# Patient Record
Sex: Female | Born: 1995 | Race: White | Hispanic: No | Marital: Single | Smoking: Never smoker
Health system: Southern US, Community
[De-identification: ages and names within clinical notes are randomized; demographics above are authoritative.]

## PROBLEM LIST (undated history)

## (undated) DIAGNOSIS — G4700 Insomnia, unspecified: Secondary | ICD-10-CM

## (undated) DIAGNOSIS — F32A Depression, unspecified: Secondary | ICD-10-CM

## (undated) DIAGNOSIS — F329 Major depressive disorder, single episode, unspecified: Secondary | ICD-10-CM

## (undated) DIAGNOSIS — F419 Anxiety disorder, unspecified: Secondary | ICD-10-CM

## (undated) DIAGNOSIS — R110 Nausea: Secondary | ICD-10-CM

## (undated) HISTORY — PX: TONSILLECTOMY: SUR1361

## (undated) HISTORY — PX: OTHER SURGICAL HISTORY: SHX169

---
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
## 2020-02-08 ENCOUNTER — Encounter (HOSPITAL_COMMUNITY): Payer: Self-pay

## 2020-02-08 ENCOUNTER — Emergency Department (HOSPITAL_COMMUNITY): Payer: Medicaid Other

## 2020-02-08 ENCOUNTER — Emergency Department (HOSPITAL_COMMUNITY)
Admission: EM | Admit: 2020-02-08 | Discharge: 2020-02-08 | Disposition: A | Discharge status: Home / Self Care | Payer: Medicaid Other | Attending: Emergency Medicine | Admitting: Emergency Medicine

## 2020-02-08 ENCOUNTER — Other Ambulatory Visit: Payer: Self-pay

## 2020-02-08 DIAGNOSIS — Y999 Unspecified external cause status: Secondary | ICD-10-CM | POA: Diagnosis not present

## 2020-02-08 DIAGNOSIS — Z79899 Other long term (current) drug therapy: Secondary | ICD-10-CM | POA: Insufficient documentation

## 2020-02-08 DIAGNOSIS — S42302A Unspecified fracture of shaft of humerus, left arm, initial encounter for closed fracture: Secondary | ICD-10-CM

## 2020-02-08 DIAGNOSIS — Y9351 Activity, roller skating (inline) and skateboarding: Secondary | ICD-10-CM | POA: Diagnosis not present

## 2020-02-08 DIAGNOSIS — Y9239 Other specified sports and athletic area as the place of occurrence of the external cause: Secondary | ICD-10-CM | POA: Insufficient documentation

## 2020-02-08 DIAGNOSIS — S42342A Displaced spiral fracture of shaft of humerus, left arm, initial encounter for closed fracture: Secondary | ICD-10-CM | POA: Insufficient documentation

## 2020-02-08 DIAGNOSIS — W19XXXA Unspecified fall, initial encounter: Secondary | ICD-10-CM

## 2020-02-08 DIAGNOSIS — M79622 Pain in left upper arm: Secondary | ICD-10-CM | POA: Diagnosis present

## 2020-02-08 HISTORY — DX: Depression, unspecified: F32.A

## 2020-02-08 HISTORY — DX: Nausea: R11.0

## 2020-02-08 HISTORY — DX: Insomnia, unspecified: G47.00

## 2020-02-08 HISTORY — DX: Anxiety disorder, unspecified: F41.9

## 2020-02-08 MED ORDER — HYDROMORPHONE HCL 1 MG/ML IJ SOLN
1.0000 mg | Freq: Once | INTRAMUSCULAR | Status: AC
  Administered 2020-02-08: 1 mg via INTRAVENOUS
  Filled 2020-02-08: qty 1

## 2020-02-08 MED ORDER — HYDROMORPHONE HCL 1 MG/ML IJ SOLN
1.0000 mg | Freq: Once | INTRAMUSCULAR | Status: AC
  Administered 2020-02-08: 21:00:00 1 mg via INTRAVENOUS
  Filled 2020-02-08: qty 1

## 2020-02-08 MED ORDER — HYDROCODONE-ACETAMINOPHEN 5-325 MG PO TABS: 1.0000 | ORAL_TABLET | Freq: Four times a day (QID) | ORAL | 0 refills | Status: DC | PRN

## 2020-02-08 MED ORDER — HYDROCODONE-ACETAMINOPHEN 5-325 MG PO TABS
1.0000 | ORAL_TABLET | Freq: Once | ORAL | Status: AC
  Administered 2020-02-08: 1 via ORAL
  Filled 2020-02-08: qty 1

## 2020-02-08 MED ORDER — SODIUM CHLORIDE 0.9 % IV SOLN: INTRAVENOUS | Status: DC

## 2020-02-08 MED ORDER — ONDANSETRON HCL 4 MG/2ML IJ SOLN
4.0000 mg | Freq: Once | INTRAMUSCULAR | Status: AC
  Administered 2020-02-08: 19:00:00 4 mg via INTRAVENOUS
  Filled 2020-02-08: qty 2

## 2020-02-08 NOTE — Discharge Instructions (Addendum)
Keep the arm splint dry.  Orthopedic office will call you in the morning to tell you what time to be seen.  The doctor's information and address provided above.  They are planning to do operative repair of this on Monday.  They will discuss all that with you tomorrow.  Take the pain medicine as needed.

## 2020-02-08 NOTE — ED Provider Notes (Signed)
[facility] Provider Note   CSN: [id number] Arrival date & time: 02/08/20  1829     History Chief Complaint  Patient presents with  . Fall    [facility] is a 24 y.o. female.  Patient brought in by EMS.  Patient was [place] skate park.  She had a fall landing on her left elbow.  Pain no other injuries no loss of consciousness no back pain no neck pain no other extremity pain.        Past Medical History:  Diagnosis Date  . Anxiety   . Chronic nausea   . Depression   . Insomnia     Patient Active Problem List   Diagnosis Date Noted  . Fracture of humeral shaft, left, closed 02/08/2020      OB History   No obstetric history on file.     No family history on file.  Social History   Tobacco Use  . Smoking status: Never Smoker  . Smokeless tobacco: Never Used  Substance Use Topics  . Alcohol use: Not Currently  . Drug use: Not Currently    Home Medications Prior to Admission medications   Medication Sig Start Date End Date Taking? Authorizing Provider  busPIRone (BUSPAR) 15 MG tablet Take 15 mg by mouth 3 (three) times daily. 01/14/20  Yes [provider]  clonazePAM (KLONOPIN) 1 MG tablet Take 0.5 mg by mouth 2 (two) times daily. 01/31/20  Yes [provider]  etonogestrel (NEXPLANON) 68 MG IMPL implant 68 mg by Subdermal route once.   Yes [provider]  hydrOXYzine (ATARAX/VISTARIL) 50 MG tablet Take 100 mg by mouth at bedtime. 01/23/20  Yes [provider]  mirtazapine (REMERON) 45 MG tablet Take 45 mg by mouth at bedtime. 01/04/20  Yes [provider]  Multiple Vitamins-Minerals (MULTIVITAMIN WOMEN PO) Take 1 tablet by mouth daily.   Yes [provider]  VIIBRYD 20 MG TABS Take 20 mg by mouth at bedtime.  01/31/20  Yes [provider]  zolpidem (AMBIEN) 10 MG tablet Take 10 mg by mouth at bedtime.  01/22/20  Yes [provider]  HYDROcodone-acetaminophen  (NORCO/VICODIN) 5-325 MG tablet Take 1 tablet by mouth every 6 (six) hours as needed for moderate pain. 02/08/20   Fredia Sorrow, MD    Allergies    Scopolamine, Adhesive [tape], and Lavender oil  Review of Systems   Review of Systems  Constitutional: Negative for chills and fever.  HENT: Negative for congestion, rhinorrhea and sore throat.   Eyes: Negative for visual disturbance.  Respiratory: Negative for cough and shortness of breath.   Cardiovascular: Negative for chest pain and leg swelling.  Gastrointestinal: Negative for abdominal pain, diarrhea, nausea and vomiting.  Genitourinary: Negative for dysuria.  Musculoskeletal: Negative for back pain and neck pain.  Skin: Negative for rash.  Neurological: Negative for dizziness, light-headedness and headaches.  Hematological: Does not bruise/bleed easily.  Psychiatric/Behavioral: Negative for confusion.    Physical Exam Updated Vital Signs BP 121/85   Pulse (!) 102   Temp 98.2 F (36.8 C) (Oral)   Resp 18   Ht 1.753 m (5\' 9" )   Wt 63.5 kg   SpO2 98%   BMI 20.67 kg/m   Physical Exam Vitals and nursing note reviewed.  Constitutional:      General: She is not in acute distress.    Appearance: Normal appearance. She is well-developed.  HENT:     Head: Normocephalic and atraumatic.  Eyes:     Extraocular Movements: Extraocular movements intact.     Conjunctiva/sclera: Conjunctivae normal.  Cardiovascular:     Rate and Rhythm: Normal rate and regular rhythm.     Heart sounds: No murmur.  Pulmonary:     Effort: Pulmonary effort is normal. No respiratory distress.     Breath sounds: Normal breath sounds.  Abdominal:     Palpations: Abdomen is soft.     Tenderness: There is no abdominal tenderness.  Musculoskeletal:        General: Tenderness present. No deformity.     Cervical back: Neck supple.     Comments: Left arm without tenderness above the elbow.  No obvious deformity but it is in a foam splint.  Radial  pulse 2+ good movement of fingers.  Sensation grossly intact some diffuse numbness subjectively but not objectively.  No pain with range of motion of the fingers wrist.  No shoulder pain.  Particular good movement of the left wrist both extension and flexion.  Patient able to give a thumbs up sign.  No signs of any radial nerve palsy.  Skin:    General: Skin is warm and dry.     Capillary Refill: Capillary refill takes less than 2 seconds.  Neurological:     General: No focal deficit present.     Mental Status: She is alert and oriented to person, place, and time.     ED Results / Procedures / Treatments   Labs (all labs ordered are listed, but only abnormal results are displayed) Labs Reviewed - No data to display  EKG None  Radiology DG Elbow 2 Views Left  Result Date: 02/08/2020 CLINICAL DATA:  Fall pain EXAM: LEFT ELBOW - 2 VIEW COMPARISON:  None. FINDINGS: There is a mildly displaced obliquely oriented fracture seen through the distal humeral shaft. There is approximately half shaft with posterior displacement of the distal humerus. Overlying soft tissue swelling. IMPRESSION: Mildly displaced obliquely oriented distal humeral shaft fracture. Electronically Signed   By: Jonna Clark M.D.   On: 02/08/2020 20:12    Procedures Procedures (including critical care time)  Medications Ordered in ED Medications  0.9 %  sodium chloride infusion ( Intravenous New Bag/Given (Non-Interop) 02/08/20 1857)  ondansetron (ZOFRAN) injection 4 mg (4 mg Intravenous Given 02/08/20 1853)  HYDROmorphone (DILAUDID) injection 1 mg (1 mg Intravenous Given 02/08/20 1853)  HYDROmorphone (DILAUDID) injection 1 mg (1 mg Intravenous Given 02/08/20 2050)    ED Course  I have reviewed the triage vital signs and the nursing notes.  Pertinent labs & imaging results that were available during my care of the patient were reviewed by me and considered in my medical decision making (see chart for details).      MDM Rules/Calculators/A&P                     Discussed with Dr. Deno Etienne orthopedics.  Discussed the patient in the office.  Recommending long-arm posterior splint.  Planning on surgery on Monday.  Patient made aware.  Final Clinical Impression(s) / ED Diagnoses Final diagnoses:  Fall, initial encounter  Closed displaced spiral fracture of shaft of left humerus, initial encounter    Rx / DC Orders ED Discharge Orders         Ordered    HYDROcodone-acetaminophen (NORCO/VICODIN) 5-325 MG tablet  Every 6 hours PRN     02/08/20 2106           Vanetta Mulders, MD 02/08/20 2109

## 2020-02-08 NOTE — Progress Notes (Signed)
Orthopedic Tech Progress Note Patient Details:  Earla Charlie Dec 16, 1996 [id number]  Ortho Devices Type of Ortho Device: Arm sling, Post (long arm) splint Ortho Device/Splint Location: LUE Ortho Device/Splint Interventions: Ordered, Application   Post Interventions Patient Tolerated: Well Instructions Provided: Care of device   Ancil Linsey 02/08/2020, 9:26 PM

## 2020-02-08 NOTE — ED Triage Notes (Signed)
Pt arrived via [facility] from [employer] park. Pt was skating when she fell off her skateboard and landed on her left elbow. Pt c/o 7/10 left arm pain. Obvious deformity noted. EMS placed left arm split on pt. Pt denies any loss of consciousness or hitting her head.

## 2020-02-09 ENCOUNTER — Encounter (HOSPITAL_COMMUNITY): Payer: Self-pay | Admitting: Orthopaedic Surgery

## 2020-02-09 ENCOUNTER — Other Ambulatory Visit (HOSPITAL_COMMUNITY)
Admission: RE | Admit: 2020-02-09 | Discharge: 2020-02-09 | Disposition: A | Discharge status: Home / Self Care | Payer: Medicaid Other | Source: Ambulatory Visit | Attending: Orthopaedic Surgery | Admitting: Orthopaedic Surgery

## 2020-02-09 ENCOUNTER — Other Ambulatory Visit: Payer: Self-pay

## 2020-02-09 ENCOUNTER — Encounter: Payer: Self-pay | Admitting: Orthopaedic Surgery

## 2020-02-09 ENCOUNTER — Ambulatory Visit: Payer: Medicaid Other | Admitting: Orthopaedic Surgery

## 2020-02-09 DIAGNOSIS — Z01812 Encounter for preprocedural laboratory examination: Secondary | ICD-10-CM | POA: Diagnosis not present

## 2020-02-09 DIAGNOSIS — Z20822 Contact with and (suspected) exposure to covid-19: Secondary | ICD-10-CM | POA: Insufficient documentation

## 2020-02-09 DIAGNOSIS — S42342A Displaced spiral fracture of shaft of humerus, left arm, initial encounter for closed fracture: Secondary | ICD-10-CM

## 2020-02-09 LAB — SARS CORONAVIRUS 2 (TAT 6-24 HRS): SARS Coronavirus 2: NEGATIVE

## 2020-02-09 MED ORDER — OXYCODONE-ACETAMINOPHEN 5-325 MG PO TABS: 1.0000 | ORAL_TABLET | Freq: Three times a day (TID) | ORAL | 0 refills | Status: AC | PRN

## 2020-02-09 NOTE — Progress Notes (Signed)
Anesthesia Chart Review: SAME DAY WORK-UP  Case: [id number] Date/Time: 02/12/20 1216   Procedure: OPEN REDUCTION INTERNAL FIXATION (ORIF) LEFT HUMERAL SHAFT FRACTURE (Left Arm Upper)   Anesthesia type: General   Pre-op diagnosis: left humeral shaft fracture   Location: [facility] ROOM 04 / [facility]   Surgeons: Tarry Kos, MD      DISCUSSION: Patient is a 24 year old female scheduled for the above procedure. On 02/08/20, she fell while at a skate park and fractured her left humerus.  History of never smoker, anxiety, depression (with history of SI), insomnia, chronic nausea, tonsillectomy.  02/09/20 presurgical COVID-19 test in process.  She is for labs and anesthesia team evaluation on the day of surgery.     VS: 02/08/20: BP 131/90, HR 100    PROVIDERS: [place] [place] [employer] is listed as primary care provider practice - Seen by cardiologist Andria Frames, MD [facility] on 04/01/18 after referral for physician in [place] for consideration of POTS as a cause of her chronic nausea. She also gets bouts of tachycardia with occasional dizziness if gets up too fast. Drinking mostly caffeinated drinks at the time. He felts POTS was unlikely and discussed possibly tachycardiac due to dehydration or physiologic from potential eating disorder. He offered a Tilt Table Test to evaluate for dysautonomia but I don't see that it was ever done.    - In review of records in Care Everywhere ([facility]), she saw GI and endocrinology last in 2018 for evaluation for chronic nausea. She had tried and failed several antidepressants, histamine antagonists, Zofran, Phenergan, and Reglan. She had a normal cosyntropin stimulation test and normal calcium and TSH, so adrenal, thyroid or parathyroid disorder not felt to be the source of her chronic nausea. PRN endocrinology follow-up recommended. GI work-up included previous "thorough evaluation by [facility] with HIDA scan, gastric emptying study,  MRI of the head, and porphyria studies negative. Prior endoscopic evaluation was unremarkable. Evaluation by ENT for inner ear disease was negative. She is followed by psychiatry for anxiety/depression. Prior KUB with fecal retention but denied constipation...CT enterography 7/31 was unremarkable." Consider referral to nutritionist and/or Dr. Ree Edman in [place] who specializes in unexplained chronic nausea.   LABS: For day of surgery.    IMAGES: Xray left elbow 02/08/20: IMPRESSION: Mildly displaced obliquely oriented distal humeral shaft fracture.   EKG: According to Result Narrative [facility]), 04/01/18 EKG showed: Ventricular Rate          76    BPM          Atrial Rate            76    BPM          P-R Interval            120    ms          QRS Duration            86    ms          Q-T Interval            372    ms          QTC                418    ms          P Axis               63  degrees        R Axis               73    degrees        T Axis               18    degrees        Sinus rhythm Nonspecific T wave changes When compared with ECG of 04-Dec-2016 14:42, Inverted T waves have replaced nonspecific T wave abnormality in inferior leads Nonspecific T wave abnormality now evident in lateral leads Confirmed by MILLER, DR. H. S. ([id number]) on 04/01/2018 1:52:06 PM    CV: N/A   Past Medical History:  Diagnosis Date  . Anxiety   . Chronic nausea   . Depression   . Insomnia     Past Surgical History:  Procedure Laterality Date  . TONSILLECTOMY    . wisdom teeth extraction      MEDICATIONS: No current facility-administered medications for this encounter.   . bismuth subsalicylate (PEPTO BISMOL) 262 MG/15ML suspension  . busPIRone (BUSPAR) 15 MG tablet  .  clonazePAM (KLONOPIN) 1 MG tablet  . etonogestrel (NEXPLANON) 68 MG IMPL implant  . hydrOXYzine (ATARAX/VISTARIL) 50 MG tablet  . ibuprofen (ADVIL) 200 MG tablet  . Melatonin 10 MG CAPS  . mirtazapine (REMERON) 45 MG tablet  . Multiple Vitamins-Minerals (MULTIVITAMIN WOMEN PO)  . oxyCODONE-acetaminophen (PERCOCET) 5-325 MG tablet  . VIIBRYD 20 MG TABS  . zolpidem (AMBIEN) 10 MG tablet  . HYDROcodone-acetaminophen (NORCO/VICODIN) 5-325 MG tablet     Myra Gianotti, PA-C Surgical Short Stay/Anesthesiology [facility] Phone [phone] [facility] Phone [phone] 02/09/2020 4:39 PM

## 2020-02-09 NOTE — Progress Notes (Signed)
Office Visit Note   Patient: [facility]           Date of Birth: [birth year]           MRN: [id number] Visit Date: 02/09/2020              Requested by: [facility] [address] [place],  [place] [address] PCP: [facility]   Assessment & Plan: Visit Diagnoses:  1. Closed displaced spiral fracture of shaft of left humerus, initial encounter     Plan: X-rays were reviewed with the patient in detail and given the unstable displaced nature of the fracture I have recommended surgical fixation for pain relief, proper healing, early mobilization.  Risk benefits alternatives were discussed with the patient and she has elected to proceed with ORIF.  Percocet was prescribed today.  We will plan on surgery Monday.  Questions encouraged and answered.  Follow-Up Instructions: Return for 1 week postop visit.   Orders:  No orders of the defined types were placed in this encounter.  Meds ordered this encounter  Medications  . oxyCODONE-acetaminophen (PERCOCET) 5-325 MG tablet    Sig: Take 1-2 tablets by mouth every 8 (eight) hours as needed for severe pain.    Dispense:  20 tablet    Refill:  0      Procedures: No procedures performed   Clinical Data: No additional findings.   Subjective: Chief Complaint  Patient presents with  . Left Elbow - Pain    [facility] is a 24 year old right-hand-dominant female comes in for evaluation of a left humerus fracture that she suffered while skateboarding yesterday.  This is ER follow-up from last night.  She was placed in a splint.  She states that she is in a lot of pain and is very uncomfortable and could not sleep last night.  Hydrocodone does not provide any pain relief.  Denies any numbness and tingling.   Review of Systems  Constitutional: Negative.   HENT: Negative.   Eyes: Negative.   Respiratory: Negative.   Cardiovascular: Negative.   Endocrine: Negative.   Musculoskeletal: Negative.     Neurological: Negative.   Hematological: Negative.   Psychiatric/Behavioral: Negative.   All other systems reviewed and are negative.    Objective: Vital Signs: There were no vitals taken for this visit.  Physical Exam Vitals and nursing note reviewed.  Constitutional:      Appearance: She is well-developed.  HENT:     Head: Normocephalic and atraumatic.  Pulmonary:     Effort: Pulmonary effort is normal.  Abdominal:     Palpations: Abdomen is soft.  Musculoskeletal:     Cervical back: Neck supple.  Skin:    General: Skin is warm.     Capillary Refill: Capillary refill takes less than 2 seconds.  Neurological:     Mental Status: She is alert and oriented to person, place, and time.  Psychiatric:        Behavior: Behavior normal.        Thought Content: Thought content normal.        Judgment: Judgment normal.     Ortho Exam Left arm exam shows intact radial, median, ulnar nerve function.  Strong radial pulse.  Hand is warm and well-perfused.  Well fitting splint. Specialty Comments:  No specialty comments available.  Imaging: DG Elbow 2 Views Left  Result Date: 02/08/2020 CLINICAL DATA:  Fall pain EXAM: LEFT ELBOW - 2 VIEW COMPARISON:  None. FINDINGS: There is  a mildly displaced obliquely oriented fracture seen through the distal humeral shaft. There is approximately half shaft with posterior displacement of the distal humerus. Overlying soft tissue swelling. IMPRESSION: Mildly displaced obliquely oriented distal humeral shaft fracture. Electronically Signed   By: Jonna Clark M.D.   On: 02/08/2020 20:12     PMFS History: Patient Active Problem List   Diagnosis Date Noted  . Fracture of humeral shaft, left, closed 02/08/2020   Past Medical History:  Diagnosis Date  . Anxiety   . Chronic nausea   . Depression   . Insomnia     History reviewed. No pertinent family history.  Past Surgical History:  Procedure Laterality Date  . TONSILLECTOMY    . wisdom  teeth extraction     Social History   Occupational History  . Not on file  Tobacco Use  . Smoking status: Never Smoker  . Smokeless tobacco: Never Used  Substance and Sexual Activity  . Alcohol use: Not Currently  . Drug use: Not Currently  . Sexual activity: Not Currently

## 2020-02-09 NOTE — Progress Notes (Signed)
Spoke with pt for pre-op call. Pt denies any cardiac history or diabetes.  Pt had her Covid test done today and states she has been in quarantine since the test was done. Pt understands quarantine continues until she comes to the hospital on Monday.

## 2020-02-09 NOTE — Anesthesia Preprocedure Evaluation (Addendum)
Anesthesia Evaluation  Patient identified by MRN, date of birth, ID band Patient awake    Reviewed: Allergy & Precautions, NPO status , Patient's Chart, lab work & pertinent test results  History of Anesthesia Complications Negative for: history of anesthetic complications  Airway Mallampati: II  TM Distance: >3 FB Neck ROM: Full    Dental  (+) Dental Advisory Given, Teeth Intact   Pulmonary neg pulmonary ROS,  02/09/2020 SARS coronavirus NEG   breath sounds clear to auscultation       Cardiovascular negative cardio ROS   Rhythm:Regular Rate:Normal     Neuro/Psych Anxiety Depression negative neurological ROS     GI/Hepatic negative GI ROS, Neg liver ROS,   Endo/Other  negative endocrine ROS  Renal/GU negative Renal ROS     Musculoskeletal   Abdominal   Peds  Hematology negative hematology ROS (+)   Anesthesia Other Findings   Reproductive/Obstetrics                            Anesthesia Physical Anesthesia Plan  ASA: II  Anesthesia Plan: General   Post-op Pain Management: GA combined w/ Regional for post-op pain   Induction: Intravenous  PONV Risk Score and Plan: 3 and Ondansetron, Dexamethasone and Treatment may vary due to age or medical condition  Airway Management Planned: Oral ETT  Additional Equipment:   Intra-op Plan:   Post-operative Plan: Extubation in OR  Informed Consent: I have reviewed the patients History and Physical, chart, labs and discussed the procedure including the risks, benefits and alternatives for the proposed anesthesia with the patient or authorized representative who has indicated his/her understanding and acceptance.     Dental advisory given  Plan Discussed with: CRNA and Surgeon  Anesthesia Plan Comments: (PAT note written 02/09/2020 by Shonna Chock, PA-C. Plan routine monitors, GETA with supraclavicular block for post op analgesia Pt  declines Exparel and declines Scopolamine patch )       Anesthesia Quick Evaluation

## 2020-02-12 ENCOUNTER — Other Ambulatory Visit: Payer: Self-pay

## 2020-02-12 ENCOUNTER — Encounter (HOSPITAL_COMMUNITY)
Admission: RE | Disposition: A | Discharge status: Home / Self Care | Payer: Self-pay | Source: Home / Self Care | Attending: Orthopaedic Surgery

## 2020-02-12 ENCOUNTER — Ambulatory Visit (HOSPITAL_COMMUNITY): Payer: Medicaid Other | Admitting: Vascular Surgery

## 2020-02-12 ENCOUNTER — Ambulatory Visit (HOSPITAL_COMMUNITY): Payer: Medicaid Other

## 2020-02-12 ENCOUNTER — Encounter (HOSPITAL_COMMUNITY): Payer: Self-pay | Admitting: Orthopaedic Surgery

## 2020-02-12 ENCOUNTER — Ambulatory Visit (HOSPITAL_COMMUNITY)
Admission: RE | Admit: 2020-02-12 | Discharge: 2020-02-12 | Disposition: A | Discharge status: Home / Self Care | Payer: Medicaid Other | Attending: Orthopaedic Surgery | Admitting: Orthopaedic Surgery

## 2020-02-12 DIAGNOSIS — S42302A Unspecified fracture of shaft of humerus, left arm, initial encounter for closed fracture: Secondary | ICD-10-CM | POA: Diagnosis not present

## 2020-02-12 DIAGNOSIS — F329 Major depressive disorder, single episode, unspecified: Secondary | ICD-10-CM | POA: Diagnosis not present

## 2020-02-12 DIAGNOSIS — F419 Anxiety disorder, unspecified: Secondary | ICD-10-CM | POA: Diagnosis not present

## 2020-02-12 DIAGNOSIS — W1830XA Fall on same level, unspecified, initial encounter: Secondary | ICD-10-CM | POA: Diagnosis not present

## 2020-02-12 DIAGNOSIS — Z419 Encounter for procedure for purposes other than remedying health state, unspecified: Secondary | ICD-10-CM

## 2020-02-12 DIAGNOSIS — S42342A Displaced spiral fracture of shaft of humerus, left arm, initial encounter for closed fracture: Secondary | ICD-10-CM

## 2020-02-12 DIAGNOSIS — G4700 Insomnia, unspecified: Secondary | ICD-10-CM | POA: Insufficient documentation

## 2020-02-12 DIAGNOSIS — Z79899 Other long term (current) drug therapy: Secondary | ICD-10-CM | POA: Diagnosis not present

## 2020-02-12 HISTORY — PX: ORIF HUMERUS FRACTURE: SHX2126

## 2020-02-12 LAB — POCT PREGNANCY, URINE: Preg Test, Ur: NEGATIVE

## 2020-02-12 SURGERY — OPEN REDUCTION INTERNAL FIXATION (ORIF) HUMERAL SHAFT FRACTURE
Anesthesia: General | Site: Arm Upper | Laterality: Left

## 2020-02-12 MED ORDER — MIDAZOLAM HCL 2 MG/2ML IJ SOLN: 2.0000 mg | Freq: Once | INTRAMUSCULAR | Status: AC

## 2020-02-12 MED ORDER — PROPOFOL 10 MG/ML IV BOLUS
INTRAVENOUS | Status: DC | PRN
  Administered 2020-02-12: 200 mg via INTRAVENOUS

## 2020-02-12 MED ORDER — PROMETHAZINE HCL 25 MG/ML IJ SOLN: 6.2500 mg | INTRAMUSCULAR | Status: DC | PRN

## 2020-02-12 MED ORDER — MIDAZOLAM HCL 2 MG/2ML IJ SOLN
INTRAMUSCULAR | Status: AC
  Filled 2020-02-12: qty 2

## 2020-02-12 MED ORDER — MEPERIDINE HCL 25 MG/ML IJ SOLN: 6.2500 mg | INTRAMUSCULAR | Status: DC | PRN

## 2020-02-12 MED ORDER — PHENYLEPHRINE 40 MCG/ML (10ML) SYRINGE FOR IV PUSH (FOR BLOOD PRESSURE SUPPORT)
PREFILLED_SYRINGE | INTRAVENOUS | Status: AC
  Filled 2020-02-12: qty 10

## 2020-02-12 MED ORDER — LACTATED RINGERS IV SOLN: INTRAVENOUS | Status: DC

## 2020-02-12 MED ORDER — ONDANSETRON HCL 4 MG/2ML IJ SOLN
INTRAMUSCULAR | Status: DC | PRN
  Administered 2020-02-12: 4 mg via INTRAVENOUS

## 2020-02-12 MED ORDER — FENTANYL CITRATE (PF) 250 MCG/5ML IJ SOLN
INTRAMUSCULAR | Status: AC
  Filled 2020-02-12: qty 5

## 2020-02-12 MED ORDER — PHENYLEPHRINE 40 MCG/ML (10ML) SYRINGE FOR IV PUSH (FOR BLOOD PRESSURE SUPPORT)
PREFILLED_SYRINGE | INTRAVENOUS | Status: DC | PRN
  Administered 2020-02-12: 80 ug via INTRAVENOUS

## 2020-02-12 MED ORDER — ONDANSETRON HCL 4 MG/2ML IJ SOLN
INTRAMUSCULAR | Status: AC
  Filled 2020-02-12: qty 2

## 2020-02-12 MED ORDER — BUPIVACAINE-EPINEPHRINE (PF) 0.5% -1:200000 IJ SOLN
INTRAMUSCULAR | Status: DC | PRN
  Administered 2020-02-12: 30 mL via PERINEURAL

## 2020-02-12 MED ORDER — DEXAMETHASONE SODIUM PHOSPHATE 10 MG/ML IJ SOLN
INTRAMUSCULAR | Status: DC | PRN
  Administered 2020-02-12: 4 mg via INTRAVENOUS

## 2020-02-12 MED ORDER — 0.9 % SODIUM CHLORIDE (POUR BTL) OPTIME
TOPICAL | Status: DC | PRN
  Administered 2020-02-12: 12:00:00 1000 mL

## 2020-02-12 MED ORDER — MIDAZOLAM HCL 2 MG/2ML IJ SOLN
INTRAMUSCULAR | Status: AC
  Administered 2020-02-12: 2 mg via INTRAVENOUS
  Filled 2020-02-12: qty 2

## 2020-02-12 MED ORDER — HYDROMORPHONE HCL 1 MG/ML IJ SOLN: 0.2500 mg | INTRAMUSCULAR | Status: DC | PRN

## 2020-02-12 MED ORDER — KETOROLAC TROMETHAMINE 10 MG PO TABS: 10.0000 mg | ORAL_TABLET | Freq: Two times a day (BID) | ORAL | 0 refills | Status: AC | PRN

## 2020-02-12 MED ORDER — LIDOCAINE 2% (20 MG/ML) 5 ML SYRINGE
INTRAMUSCULAR | Status: DC | PRN
  Administered 2020-02-12: 20 mg via INTRAVENOUS

## 2020-02-12 MED ORDER — VANCOMYCIN HCL 1000 MG IV SOLR
INTRAVENOUS | Status: AC
  Filled 2020-02-12: qty 1000

## 2020-02-12 MED ORDER — METHOCARBAMOL 500 MG PO TABS: 500.0000 mg | ORAL_TABLET | Freq: Four times a day (QID) | ORAL | 2 refills | Status: AC | PRN

## 2020-02-12 MED ORDER — DEXAMETHASONE SODIUM PHOSPHATE 10 MG/ML IJ SOLN
INTRAMUSCULAR | Status: AC
  Filled 2020-02-12: qty 1

## 2020-02-12 MED ORDER — CEFAZOLIN SODIUM-DEXTROSE 2-4 GM/100ML-% IV SOLN
2.0000 g | INTRAVENOUS | Status: AC
  Administered 2020-02-12: 2 g via INTRAVENOUS
  Filled 2020-02-12: qty 100

## 2020-02-12 MED ORDER — MIDAZOLAM HCL 2 MG/2ML IJ SOLN: 0.5000 mg | Freq: Once | INTRAMUSCULAR | Status: DC | PRN

## 2020-02-12 MED ORDER — FENTANYL CITRATE (PF) 100 MCG/2ML IJ SOLN: 100.0000 ug | Freq: Once | INTRAMUSCULAR | Status: AC

## 2020-02-12 MED ORDER — FENTANYL CITRATE (PF) 100 MCG/2ML IJ SOLN
INTRAMUSCULAR | Status: AC
  Administered 2020-02-12: 100 ug via INTRAVENOUS
  Filled 2020-02-12: qty 2

## 2020-02-12 MED ORDER — CHLORHEXIDINE GLUCONATE 4 % EX LIQD: 60.0000 mL | Freq: Once | CUTANEOUS | Status: DC

## 2020-02-12 MED ORDER — OXYCODONE-ACETAMINOPHEN 5-325 MG PO TABS: 1.0000 | ORAL_TABLET | Freq: Three times a day (TID) | ORAL | 0 refills | Status: AC | PRN

## 2020-02-12 MED ORDER — TRANEXAMIC ACID-NACL 1000-0.7 MG/100ML-% IV SOLN
INTRAVENOUS | Status: AC
  Filled 2020-02-12: qty 100

## 2020-02-12 MED ORDER — TRANEXAMIC ACID-NACL 1000-0.7 MG/100ML-% IV SOLN
INTRAVENOUS | Status: DC | PRN
  Administered 2020-02-12: 1000 mg via INTRAVENOUS

## 2020-02-12 MED ORDER — PROPOFOL 10 MG/ML IV BOLUS
INTRAVENOUS | Status: AC
  Filled 2020-02-12: qty 20

## 2020-02-12 MED ORDER — ROCURONIUM BROMIDE 10 MG/ML (PF) SYRINGE
PREFILLED_SYRINGE | INTRAVENOUS | Status: DC | PRN
  Administered 2020-02-12: 60 mg via INTRAVENOUS

## 2020-02-12 MED ORDER — VANCOMYCIN HCL 1000 MG IV SOLR
INTRAVENOUS | Status: DC | PRN
  Administered 2020-02-12: 1000 mg

## 2020-02-12 SURGICAL SUPPLY — 68 items
BIT DRILL CALIBRATED 2.7 (BIT) ×2 IMPLANT
BLADE SURG 10 STRL SS (BLADE) IMPLANT
BNDG ESMARK 4X9 LF (GAUZE/BANDAGES/DRESSINGS) IMPLANT
CLSR STERI-STRIP ANTIMIC 1/2X4 (GAUZE/BANDAGES/DRESSINGS) ×6 IMPLANT
COVER SURGICAL LIGHT HANDLE (MISCELLANEOUS) ×2 IMPLANT
COVER WAND RF STERILE (DRAPES) ×2 IMPLANT
CUFF TOURN SGL QUICK 18X4 (TOURNIQUET CUFF) ×2 IMPLANT
CUFF TOURN SGL QUICK 24 (TOURNIQUET CUFF)
CUFF TRNQT CYL 24X4X16.5-23 (TOURNIQUET CUFF) IMPLANT
DRAPE C-ARM 42X72 X-RAY (DRAPES) ×2 IMPLANT
DRAPE IMP U-DRAPE 54X76 (DRAPES) ×2 IMPLANT
DRAPE INCISE IOBAN 66X45 STRL (DRAPES) ×2 IMPLANT
DRAPE U-SHAPE 47X51 STRL (DRAPES) ×2 IMPLANT
DRSG TEGADERM 4X4.75 (GAUZE/BANDAGES/DRESSINGS) ×14 IMPLANT
ELECT CAUTERY BLADE 6.4 (BLADE) ×2 IMPLANT
ELECT REM PT RETURN 9FT ADLT (ELECTROSURGICAL) ×2
ELECTRODE REM PT RTRN 9FT ADLT (ELECTROSURGICAL) ×1 IMPLANT
FACESHIELD WRAPAROUND (MASK) ×2 IMPLANT
GAUZE SPONGE 4X4 12PLY STRL (GAUZE/BANDAGES/DRESSINGS) ×2 IMPLANT
GAUZE XEROFORM 5X9 LF (GAUZE/BANDAGES/DRESSINGS) ×2 IMPLANT
GLOVE BIOGEL PI IND STRL 7.0 (GLOVE) ×1 IMPLANT
GLOVE BIOGEL PI INDICATOR 7.0 (GLOVE) ×1
GLOVE ECLIPSE 7.0 STRL STRAW (GLOVE) ×2 IMPLANT
GLOVE SKINSENSE NS SZ7.5 (GLOVE) ×2
GLOVE SKINSENSE STRL SZ7.5 (GLOVE) ×2 IMPLANT
GOWN STRL REIN XL XLG (GOWN DISPOSABLE) ×6 IMPLANT
K-WIRE ACE 1.6X6 (WIRE) ×4
KIT BASIN OR (CUSTOM PROCEDURE TRAY) ×2 IMPLANT
KIT TURNOVER KIT B (KITS) ×2 IMPLANT
KWIRE ACE 1.6X6 (WIRE) ×2 IMPLANT
LOOP VESSEL MAXI BLUE (MISCELLANEOUS) ×2 IMPLANT
MANIFOLD NEPTUNE II (INSTRUMENTS) ×2 IMPLANT
NS IRRIG 1000ML POUR BTL (IV SOLUTION) ×2 IMPLANT
PACK SHOULDER (CUSTOM PROCEDURE TRAY) ×2 IMPLANT
PACK UNIVERSAL I (CUSTOM PROCEDURE TRAY) ×2 IMPLANT
PAD ARMBOARD 7.5X6 YLW CONV (MISCELLANEOUS) ×4 IMPLANT
PAD CAST 4YDX4 CTTN HI CHSV (CAST SUPPLIES) ×1 IMPLANT
PADDING CAST COTTON 4X4 STRL (CAST SUPPLIES) ×1
PLATE DIS HUMERUS SM EX-ARTIC (Plate) ×2 IMPLANT
SCREW CORT LP 3.5X12 (Screw) ×2 IMPLANT
SCREW CORT LP 3.5X14 (Screw) ×4 IMPLANT
SCREW CORT LP T15 3.5X16 (Screw) ×2 IMPLANT
SCREW LOCK CORT STAR 3.5X14 (Screw) ×2 IMPLANT
SCREW LOCK CORT STAR 3.5X16 (Screw) ×2 IMPLANT
SCREW LOCK CORT STAR 3.5X18 (Screw) ×2 IMPLANT
SCREW LOCK CORT STAR 3.5X22 (Screw) ×2 IMPLANT
SCREW LP NL T15 3.5X20 (Screw) ×2 IMPLANT
SCREW LP NL T15 3.5X22 (Screw) ×8 IMPLANT
SCREW TIS LP 3.5X18 NS (Screw) ×2 IMPLANT
SLING ARM IMMOBILIZER LRG (SOFTGOODS) ×2 IMPLANT
SPONGE LAP 18X18 RF (DISPOSABLE) ×2 IMPLANT
STAPLER VISISTAT 35W (STAPLE) IMPLANT
SUCTION FRAZIER HANDLE 10FR (MISCELLANEOUS)
SUCTION TUBE FRAZIER 10FR DISP (MISCELLANEOUS) IMPLANT
SUT ETHILON 3 0 PS 1 (SUTURE) ×4 IMPLANT
SUT FIBERTAPE CERCLAGE 2 48 (Miscellaneous) ×4 IMPLANT
SUT VIC AB 0 CT1 27 (SUTURE) ×2
SUT VIC AB 0 CT1 27XBRD ANBCTR (SUTURE) ×2 IMPLANT
SUT VIC AB 2-0 CT1 27 (SUTURE) ×2
SUT VIC AB 2-0 CT1 TAPERPNT 27 (SUTURE) ×2 IMPLANT
SUTURE TAPE TIGERLINK 1.3MM BL (SUTURE) ×1 IMPLANT
SUTURETAPE TIGERLINK 1.3MM BL (SUTURE) ×2
SYR CONTROL 10ML LL (SYRINGE) IMPLANT
TOWEL GREEN STERILE (TOWEL DISPOSABLE) ×2 IMPLANT
TOWEL GREEN STERILE FF (TOWEL DISPOSABLE) IMPLANT
UNDERPAD 30X30 (UNDERPADS AND DIAPERS) ×2 IMPLANT
WASHER 3.5MM (Orthopedic Implant) ×8 IMPLANT
WATER STERILE IRR 1000ML POUR (IV SOLUTION) ×2 IMPLANT

## 2020-02-12 NOTE — Anesthesia Procedure Notes (Signed)
Anesthesia Regional Block: Supraclavicular block   Pre-Anesthetic Checklist: ,, timeout performed, Correct Patient, Correct Site, Correct Laterality, Correct Procedure, Correct Position, site marked, Risks and benefits discussed,  Surgical consent,  Pre-op evaluation,  At surgeon's request and post-op pain management  Laterality: Left and Upper  Prep: chloraprep       Needles:  Injection technique: Single-shot  Needle Type: Echogenic Stimulator Needle     Needle Length: 9cm  Needle Gauge: 21     Additional Needles:   Procedures:, nerve stimulator,,, ultrasound used (permanent image in chart),,,,   Nerve Stimulator or Paresthesia:  Response: fingers twitch, 0.4 mA, 0.1 ms,   Additional Responses:   Narrative:  Start time: 02/12/2020 11:20 AM End time: 02/12/2020 11:26 AM Injection made incrementally with aspirations every 5 mL.  Performed by: Personally  Anesthesiologist: Jairo Ben, MD  Additional Notes: Pt identified in Holding room.  Monitors applied. Working IV access confirmed. Sterile prep, drape L clavicle and neck.  #21ga ECHOgenic PNS to finger twitch at 0.31mA threshold with US guidance.  30cc 0.5% Bupivacaine with 1:200k epi injected incrementally after negative test dose.  Patient asymptomatic, VSS, no heme aspirated, tolerated well.  Sandford Craze, MD

## 2020-02-12 NOTE — Discharge Instructions (Signed)

## 2020-02-12 NOTE — Transfer of Care (Signed)
Immediate Anesthesia Transfer of Care Note  Patient: [facility]  Procedure(s) Performed: OPEN REDUCTION INTERNAL FIXATION (ORIF) LEFT HUMERAL SHAFT FRACTURE (Left Arm Upper)  Patient Location: PACU  Anesthesia Type:GA combined with regional for post-op pain  Level of Consciousness: drowsy and patient cooperative  Airway & Oxygen Therapy: Patient Spontanous Breathing and Patient connected to nasal cannula oxygen  Post-op Assessment: Report given to RN, Post -op Vital signs reviewed and stable and Patient moving all extremities  Post vital signs: Reviewed and stable  Last Vitals:  Vitals Value Taken Time  BP 135/91 02/12/20 1454  Temp    Pulse 118 02/12/20 1457  Resp 15 02/12/20 1457  SpO2 100 % 02/12/20 1457  Vitals shown include unvalidated device data.  Last Pain:  Vitals:   02/12/20 1125  TempSrc:   PainSc: 0-No pain      Patients Stated Pain Goal: 0 (02/12/20 1125)  Complications: No apparent anesthesia complications

## 2020-02-12 NOTE — H&P (Signed)
PREOPERATIVE H&P  Chief Complaint: left humeral shaft fracture  HPI: Angel Armstrong is a 24 y.o. female who presents for surgical treatment of left humeral shaft fracture.  She denies any changes in medical history.  Past Medical History:  Diagnosis Date  . Anxiety   . Chronic nausea   . Depression   . Insomnia    Past Surgical History:  Procedure Laterality Date  . TONSILLECTOMY    . wisdom teeth extraction     Social History   Socioeconomic History  . Marital status: Single    Spouse name: Not on file  . Number of children: Not on file  . Years of education: Not on file  . Highest education level: Not on file  Occupational History  . Not on file  Tobacco Use  . Smoking status: Never Smoker  . Smokeless tobacco: Never Used  Substance and Sexual Activity  . Alcohol use: Not Currently  . Drug use: Not Currently  . Sexual activity: Not Currently  Other Topics Concern  . Not on file  Social History Narrative  . Not on file   Social Determinants of Health   Financial Resource Strain:   . Difficulty of Paying Living Expenses: Not on file  Food Insecurity:   . Worried About Programme researcher, broadcasting/film/video in the Last Year: Not on file  . Ran Out of Food in the Last Year: Not on file  Transportation Needs:   . Lack of Transportation (Medical): Not on file  . Lack of Transportation (Non-Medical): Not on file  Physical Activity:   . Days of Exercise per Week: Not on file  . Minutes of Exercise per Session: Not on file  Stress:   . Feeling of Stress : Not on file  Social Connections:   . Frequency of Communication with Friends and Family: Not on file  . Frequency of Social Gatherings with Friends and Family: Not on file  . Attends Religious Services: Not on file  . Active Member of Clubs or Organizations: Not on file  . Attends Banker Meetings: Not on file  . Marital Status: Not on file   History reviewed. No pertinent family history. Allergies  Allergen  Reactions  . Scopolamine Other (See Comments)    Severe blurred vision per pt  . Adhesive [Tape] Rash  . Lavender Oil Rash   Prior to Admission medications   Medication Sig Start Date End Date Taking? Authorizing Provider  bismuth subsalicylate (PEPTO BISMOL) 262 MG/15ML suspension Take 30 mLs by mouth every 6 (six) hours as needed for indigestion.   Yes [provider]  busPIRone (BUSPAR) 15 MG tablet Take 15 mg by mouth 3 (three) times daily. 01/14/20  Yes [provider]  clonazePAM (KLONOPIN) 1 MG tablet Take 0.5 mg by mouth 2 (two) times daily. 01/31/20  Yes [provider]  etonogestrel (NEXPLANON) 68 MG IMPL implant 68 mg by Subdermal route once.   Yes [provider]  hydrOXYzine (ATARAX/VISTARIL) 50 MG tablet Take 100 mg by mouth at bedtime. 01/23/20  Yes [provider]  ibuprofen (ADVIL) 200 MG tablet Take 400 mg by mouth every 6 (six) hours as needed for moderate pain.   Yes [provider]  Melatonin 10 MG CAPS Take 10 mg by mouth at bedtime as needed (sleep).   Yes [provider]  mirtazapine (REMERON) 45 MG tablet Take 45 mg by mouth at bedtime. 01/04/20  Yes [provider]  Multiple Vitamins-Minerals (MULTIVITAMIN  WOMEN PO) Take 1 tablet by mouth daily.   Yes [provider]  oxyCODONE-acetaminophen (PERCOCET) 5-325 MG tablet Take 1-2 tablets by mouth every 8 (eight) hours as needed for severe pain. 02/09/20  Yes Jenaya Saar, Marylynn Pearson, MD  VIIBRYD 20 MG TABS Take 20 mg by mouth at bedtime.  01/31/20  Yes [provider]  zolpidem (AMBIEN) 10 MG tablet Take 10 mg by mouth at bedtime.  01/22/20  Yes [provider]  HYDROcodone-acetaminophen (NORCO/VICODIN) 5-325 MG tablet Take 1 tablet by mouth every 6 (six) hours as needed for moderate pain. Patient not taking: Reported on 02/09/2020 02/08/20   Fredia Sorrow, MD  ketorolac (TORADOL) 10 MG tablet Take 1 tablet (10 mg total) by mouth 2 (two) times  daily as needed. 02/12/20   Leandrew Koyanagi, MD  methocarbamol (ROBAXIN) 500 MG tablet Take 1 tablet (500 mg total) by mouth every 6 (six) hours as needed for muscle spasms. 02/12/20   Leandrew Koyanagi, MD  oxyCODONE-acetaminophen (PERCOCET) 5-325 MG tablet Take 1-2 tablets by mouth every 8 (eight) hours as needed for severe pain. 02/12/20   Leandrew Koyanagi, MD     Positive ROS: All other systems have been reviewed and were otherwise negative with the exception of those mentioned in the HPI and as above.  Physical Exam: General: Alert, no acute distress Cardiovascular: No pedal edema Respiratory: No cyanosis, no use of accessory musculature GI: abdomen soft Skin: No lesions in the area of chief complaint Neurologic: Sensation intact distally Psychiatric: Patient is competent for consent with normal mood and affect Lymphatic: no lymphedema  MUSCULOSKELETAL: exam stable  Assessment: left humeral shaft fracture  Plan: Plan for Procedure(s): OPEN REDUCTION INTERNAL FIXATION (ORIF) LEFT HUMERAL SHAFT FRACTURE  The risks benefits and alternatives were discussed with the patient including but not limited to the risks of nonoperative treatment, versus surgical intervention including infection, bleeding, nerve injury,  blood clots, cardiopulmonary complications, morbidity, mortality, among others, and they were willing to proceed.   Eduard Roux, MD   02/12/2020 2:18 PM

## 2020-02-12 NOTE — Anesthesia Procedure Notes (Signed)
Procedure Name: Intubation Date/Time: 02/12/2020 11:44 AM Performed by: Moshe Salisbury, CRNA Pre-anesthesia Checklist: Patient identified, Emergency Drugs available, Suction available and Patient being monitored Patient Re-evaluated:Patient Re-evaluated prior to induction Oxygen Delivery Method: Circle System Utilized Preoxygenation: Pre-oxygenation with 100% oxygen Induction Type: IV induction Ventilation: Mask ventilation without difficulty Laryngoscope Size: Mac and 3 Grade View: Grade I Tube type: Oral Tube size: 7.5 mm Number of attempts: 1 Airway Equipment and Method: Stylet Placement Confirmation: ETT inserted through vocal cords under direct vision,  positive ETCO2 and breath sounds checked- equal and bilateral Secured at: 20 cm Tube secured with: Tape Dental Injury: Teeth and Oropharynx as per pre-operative assessment

## 2020-02-12 NOTE — Op Note (Signed)
   Date of Surgery: 02/12/2020  INDICATIONS: Angel Armstrong is a 24 y.o.-year-old female with a left humeral shaft fracture;  The patient did consent to the procedure after discussion of the risks and benefits.  PREOPERATIVE DIAGNOSIS: Left humeral shaft fracture  POSTOPERATIVE DIAGNOSIS: Same.  PROCEDURE:  1.  Open reduction internal fixation of left humeral shaft fracture 2.  Neurolysis of left radial nerve  SURGEON: N. Glee Arvin, M.D.  ASSIST: Starlyn Skeans [place], [place]; necessary for the timely completion of procedure and due to complexity of procedure.  ANESTHESIA:  general, supraclavicular block  IV FLUIDS AND URINE: See anesthesia.  ESTIMATED BLOOD LOSS: 100 mL.  IMPLANTS: Biomet long posterior lateral condyle plate, Arthrex cerclage fiber tape  DRAINS: None  COMPLICATIONS: see description of procedure.  DESCRIPTION OF PROCEDURE: The patient was brought to the operating room.  The patient had been signed prior to the procedure and this was documented. The patient had the anesthesia placed by the anesthesiologist.  A time-out was performed to confirm that this was the correct patient, site, side and location. The patient did receive antibiotics prior to the incision and was re-dosed during the procedure as needed at indicated intervals.  The patient was positioned in the right lateral decubitus position with all bony prominences well-padded.  The patient had the operative extremity prepped and draped in the standard surgical fashion.    An incision was made directly on the posterior aspect of the arm and elbow.  Full-thickness flaps were elevated off of the triceps fascia.  The triceps was then bluntly split in line with the incision down to the humeral shaft.  The radial nerve was identified and neurolysis was performed both distally and proximally and mobilized.  This was tagged with a vessel loop.  After neurolysis we then bluntly elevated the triceps muscle off of the humeral  shaft.  The fracture lines were visualized and there was a large butterfly fragment.  The butterfly fragment was first provisionally clamped to the proximal segment and a cerclage fiber tape was used to provisionally fix the butterfly fragment to the proximal humeral shaft into anatomic alignment.  We then reduced the distal segment to the proximal segment with traction and rotation this was then clamped and then a second cerclage fiber tape was looped around the fracture in order to reduce this anatomically.  This was then checked under x-ray.  We then placed a a long posterior lateral condyle plate at the appropriate position which was then bent and contoured to the anatomy.  This was confirmed under fluoroscopy.  Series of nonlocking and locking screws were placed through the plate into the humerus and distal humerus each with excellent fixation.  Final x-rays were taken.  The surgical wound was then thoroughly irrigated and closed in a layered fashion using 0 Vicryl, 2-0 Vicryl, 4-0 Monocryl.  Sterile dressings were applied.  Posterior splint was placed.  Shoulder sling was placed.  Patient tolerated procedure well had no me complications.  POSTOPERATIVE PLAN: Discharge home and follow-up in 1 week.  Angel Reel, MD 1:46 PM

## 2020-02-13 ENCOUNTER — Telehealth: Payer: Self-pay | Admitting: Radiology

## 2020-02-13 NOTE — Telephone Encounter (Signed)
Legs are swelling which was going on over the weekend, per pts mom she has not been active, and has questions about meds. She is taking Oxycodone and and wants to know if she should be using tylenol too or not.

## 2020-02-13 NOTE — Anesthesia Postprocedure Evaluation (Signed)
Anesthesia Post Note  Patient: Cherene Dobbins  Procedure(s) Performed: OPEN REDUCTION INTERNAL FIXATION (ORIF) LEFT HUMERAL SHAFT FRACTURE (Left Arm Upper)     Patient location during evaluation: PACU Anesthesia Type: General and Regional Level of consciousness: awake and alert Pain management: pain level controlled Vital Signs Assessment: post-procedure vital signs reviewed and stable Respiratory status: spontaneous breathing, nonlabored ventilation, respiratory function stable and patient connected to nasal cannula oxygen Cardiovascular status: blood pressure returned to baseline and stable Postop Assessment: no apparent nausea or vomiting Anesthetic complications: no    Last Vitals:  Vitals:   02/12/20 1454 02/12/20 1509  BP: (!) 135/91 133/78  Pulse: (!) 126 (!) 112  Resp: 15 14  Temp: 36.6 C   SpO2: 100% 100%    Last Pain:  Vitals:   02/12/20 1454  TempSrc:   PainSc: 0-No pain                 Betta Balla S

## 2020-02-13 NOTE — Telephone Encounter (Signed)
No tylenol if percocet, not plain oxy.  Make sure no pain to either calf

## 2020-02-14 ENCOUNTER — Encounter: Payer: Self-pay | Admitting: *Deleted

## 2020-02-14 ENCOUNTER — Telehealth: Payer: Self-pay | Admitting: Orthopaedic Surgery

## 2020-02-14 ENCOUNTER — Other Ambulatory Visit: Payer: Self-pay

## 2020-02-14 DIAGNOSIS — M79662 Pain in left lower leg: Secondary | ICD-10-CM

## 2020-02-14 DIAGNOSIS — M79661 Pain in right lower leg: Secondary | ICD-10-CM

## 2020-02-14 DIAGNOSIS — M7989 Other specified soft tissue disorders: Secondary | ICD-10-CM

## 2020-02-14 NOTE — Telephone Encounter (Signed)
Some pain in the calf area she said.

## 2020-02-14 NOTE — Telephone Encounter (Signed)
Can we get doppler  u/s please to r/o dvt.  If chest pain or sob, needs to go to hospital

## 2020-02-14 NOTE — Telephone Encounter (Signed)
Patient's mother called.   Her daughter is experiencing swelling in the area she received surgery on. She wants to know if this is normal and advisement if it is not.   Call back number: [phone]

## 2020-02-14 NOTE — Telephone Encounter (Signed)
I emailed order to Angel Armstrong with [facility], they will contact pt to schedule and let me know of appt

## 2020-02-14 NOTE — Telephone Encounter (Signed)
Order made STAT. Patient aware.  She would like to go somewhere in [place].

## 2020-02-14 NOTE — Telephone Encounter (Signed)
See other msg

## 2020-02-16 ENCOUNTER — Telehealth: Payer: Self-pay | Admitting: Orthopaedic Surgery

## 2020-02-16 ENCOUNTER — Telehealth: Payer: Self-pay

## 2020-02-16 NOTE — Telephone Encounter (Signed)
See other msg

## 2020-02-16 NOTE — Telephone Encounter (Signed)
Mom called for patient stating patient is having unusual increased swelling in her arm and even in her legs and feet. Wants a call back ASAP please  Mom [phone]

## 2020-02-16 NOTE — Telephone Encounter (Signed)
Patient's mom called and stated patient had surgery 2/15 and had a plate in her arm and wanted to know if she will receive wound care

## 2020-02-16 NOTE — Telephone Encounter (Signed)
Pt is scheduled today at [facility] at [address] [place], [place] at 4:00pm with 345pm appt. I tried calling pt mom back to give appt information, lm to return my call.

## 2020-02-16 NOTE — Telephone Encounter (Signed)
See message.

## 2020-02-16 NOTE — Telephone Encounter (Signed)
Spoke to mom.  Operative arm and bilateral legs swollen.  No calf pain per mom.  No chest pain or sob.  Has not been elevating arm or BLE since this am.  When trying to schedule u/s a few days ago, evidently they wanted to go somewhere in [place].  I told them if we can get them in sooner here in [place], could they come.  Mom said yes, they can come today if needed.  I also told her if daughter were to get chest pain or sob, they need to go to hospital

## 2020-02-19 ENCOUNTER — Telehealth: Payer: Self-pay | Admitting: Radiology

## 2020-02-19 NOTE — Telephone Encounter (Signed)
Patient's mother, Santina Evans, called triage line this morning stating her daughter is experiencing some problems after the nerve block that she received for surgery. Her left ear is numb and she is now having twinges and different sensations behind that ear. She spoke with on call doctor yesterday who asked her to call today and be seen by Dr. Roda Shutters. I advised Dr. Roda Shutters is in surgery today and offered appointment for 3:30p tomorrow afternoon. Santina Evans is also taking care of her mother and has another appointment in [place] at 1pm and does not think they can make it in. I advised I would send a message to Marisue Ivan to see if there is something Dr. Roda Shutters recommends or if another time slot should be opened for patient to be worked in.  Please call Santina Evans at [phone] to advise.

## 2020-02-19 NOTE — Telephone Encounter (Signed)
Spoke with mom

## 2020-02-19 NOTE — Telephone Encounter (Signed)
Thank you :)

## 2020-02-20 ENCOUNTER — Telehealth: Payer: Self-pay | Admitting: Orthopaedic Surgery

## 2020-02-20 NOTE — Telephone Encounter (Signed)
Patient's mother called.   They live out of town but they need a note detailing the patient's condition. She had to withdraw from her college courses and needs proof.   Email: [email or web address]   Call back: [phone]

## 2020-02-21 ENCOUNTER — Telehealth: Payer: Self-pay | Admitting: Orthopaedic Surgery

## 2020-02-21 NOTE — Telephone Encounter (Signed)
Angel Armstrong had surgery on her arm on whatever day and she should be out of school for 6 weeks

## 2020-02-21 NOTE — Telephone Encounter (Signed)
Emailed pt release of records form. Advised however she completes form (pick up or mail) we will process.once we receive signed form.

## 2020-02-21 NOTE — Telephone Encounter (Signed)
Emailed note

## 2020-02-21 NOTE — Telephone Encounter (Signed)
See message below. Let  me know what you would like to say on the note.

## 2020-02-27 ENCOUNTER — Other Ambulatory Visit: Payer: Self-pay

## 2020-02-27 ENCOUNTER — Ambulatory Visit (INDEPENDENT_AMBULATORY_CARE_PROVIDER_SITE_OTHER): Payer: Medicaid Other

## 2020-02-27 ENCOUNTER — Inpatient Hospital Stay: Payer: Medicaid Other | Admitting: Orthopaedic Surgery

## 2020-02-27 ENCOUNTER — Encounter: Payer: Self-pay | Admitting: Orthopaedic Surgery

## 2020-02-27 ENCOUNTER — Ambulatory Visit (INDEPENDENT_AMBULATORY_CARE_PROVIDER_SITE_OTHER): Payer: Medicaid Other | Admitting: Orthopaedic Surgery

## 2020-02-27 ENCOUNTER — Telehealth: Payer: Self-pay | Admitting: Orthopaedic Surgery

## 2020-02-27 DIAGNOSIS — S42342A Displaced spiral fracture of shaft of humerus, left arm, initial encounter for closed fracture: Secondary | ICD-10-CM

## 2020-02-27 NOTE — Telephone Encounter (Signed)
Please call mom to discuss todays visit.

## 2020-02-27 NOTE — Telephone Encounter (Signed)
Patient's mom called and stated she was unable to come in appt w/daughter and has questions to ask about appt.  Please call patient @ [phone]

## 2020-02-27 NOTE — Progress Notes (Signed)
   Post-Op Visit Note   Patient: Angel Armstrong           Date of Birth: November 06, 1996           MRN: [id number] Visit Date: 02/27/2020 PCP: [facility]   Assessment & Plan:  Chief Complaint:  Chief Complaint  Patient presents with  . Left Arm - Routine Post Op, Pain   Visit Diagnoses:  1. Closed displaced spiral fracture of shaft of left humerus, initial encounter     Plan: Angel Armstrong is 2-week status post ORIF left humerus fracture.  She is feeling markedly better.  She has some pain at times but she has no real complaints.  Surgical incision is healed without any signs of infection.  She has expected moderate postsurgical swelling.  Neurovascular intact distally.  X-rays are unremarkable.  At this point we will begin OT for range of motion.  She will continue to be nonweightbearing.  Sling in public.  Recheck in 4 weeks with two-view x-rays of the left humerus.  Follow-Up Instructions: Return in about 4 weeks (around 03/26/2020).   Orders:  Orders Placed This Encounter  Procedures  . XR Humerus Left   No orders of the defined types were placed in this encounter.   Imaging: XR Humerus Left  Result Date: 02/27/2020 Stable fixation alignment of humerus fracture.  No hardware complications.   PMFS History: Patient Active Problem List   Diagnosis Date Noted  . Fracture of humeral shaft, left, closed 02/08/2020   Past Medical History:  Diagnosis Date  . Anxiety   . Chronic nausea   . Depression   . Insomnia     History reviewed. No pertinent family history.  Past Surgical History:  Procedure Laterality Date  . ORIF HUMERUS FRACTURE Left 02/12/2020   Procedure: OPEN REDUCTION INTERNAL FIXATION (ORIF) LEFT HUMERAL SHAFT FRACTURE;  Surgeon: Tarry Kos, MD;  Location: [facility] OR;  Service: Orthopedics;  Laterality: Left;  . TONSILLECTOMY    . wisdom teeth extraction     Social History   Occupational History  . Not on file  Tobacco Use  . Smoking status: Never  Smoker  . Smokeless tobacco: Never Used  Substance and Sexual Activity  . Alcohol use: Not Currently  . Drug use: Not Currently  . Sexual activity: Not Currently

## 2020-02-28 ENCOUNTER — Telehealth: Payer: Self-pay | Admitting: Orthopaedic Surgery

## 2020-02-28 NOTE — Telephone Encounter (Signed)
Patient's mother called.   She was not allowed to go back with her daughter and is now requesting a call back to discuss details of her daughter's appointment.   Call back: [phone]

## 2020-02-28 NOTE — Telephone Encounter (Signed)
Spoke to her.

## 2020-02-28 NOTE — Telephone Encounter (Signed)
Spoke to mom

## 2020-02-28 NOTE — Telephone Encounter (Signed)
Please call. Thanks.

## 2020-02-29 NOTE — Telephone Encounter (Signed)
Spoke to mom yesterday

## 2020-03-01 ENCOUNTER — Telehealth: Payer: Self-pay | Admitting: Physician Assistant

## 2020-03-01 NOTE — Telephone Encounter (Signed)
Patient's mother called and stated she needs a referral to a PT in [place]  Please call@[phone]

## 2020-03-04 ENCOUNTER — Telehealth: Payer: Self-pay | Admitting: Orthopaedic Surgery

## 2020-03-04 ENCOUNTER — Other Ambulatory Visit: Payer: Self-pay | Admitting: Radiology

## 2020-03-04 DIAGNOSIS — S42342A Displaced spiral fracture of shaft of humerus, left arm, initial encounter for closed fracture: Secondary | ICD-10-CM

## 2020-03-04 NOTE — Telephone Encounter (Signed)
Spoke with mother again. Stated she took script and facility would not accept script that was given to patient. Referral and specific orders must be faxed to facility by office. Please advise orders and I will fax over.

## 2020-03-04 NOTE — Telephone Encounter (Signed)
Ok that's fine.  I gave them a prescription when I saw her last time.  So she should just be able to call them to schedule and take the script with her

## 2020-03-04 NOTE — Telephone Encounter (Signed)
OT eval and treat s/p ORIF humerus. Shoulder, elbow, wrist ROM. NWB.

## 2020-03-04 NOTE — Telephone Encounter (Signed)
Order placed. Faxed referral to [phone]

## 2020-03-04 NOTE — Telephone Encounter (Signed)
Please advise for physical therapy orders.  Patient mother wants referral sent to [facility] Fax # [phone]  Thank you

## 2020-03-04 NOTE — Telephone Encounter (Signed)
Patient's mother called.   She is requesting a referral to a PT facility closer to them.  Call back: [phone]

## 2020-03-13 ENCOUNTER — Telehealth: Payer: Self-pay | Admitting: Orthopaedic Surgery

## 2020-03-13 NOTE — Telephone Encounter (Signed)
Patient's mother called and stated she wanted someone from [place] office to call her in regards to 1 stitch that was left on elbow and is hurting patient.  Please call @ [phone]

## 2020-03-14 ENCOUNTER — Ambulatory Visit (INDEPENDENT_AMBULATORY_CARE_PROVIDER_SITE_OTHER): Payer: Medicaid Other

## 2020-03-14 ENCOUNTER — Other Ambulatory Visit: Payer: Self-pay

## 2020-03-14 ENCOUNTER — Encounter: Payer: Self-pay | Admitting: Orthopaedic Surgery

## 2020-03-14 DIAGNOSIS — S42342A Displaced spiral fracture of shaft of humerus, left arm, initial encounter for closed fracture: Secondary | ICD-10-CM

## 2020-03-14 NOTE — Progress Notes (Signed)
Patient came into the office to  get 1 stitch removed from her arm. She will follow up with [place] as scheduled.

## 2020-03-14 NOTE — Telephone Encounter (Signed)
Want her to come in?

## 2020-03-14 NOTE — Telephone Encounter (Signed)
Yes please

## 2020-03-14 NOTE — Telephone Encounter (Signed)
Appt made

## 2020-03-26 ENCOUNTER — Encounter: Payer: Self-pay | Admitting: Physician Assistant

## 2020-03-26 ENCOUNTER — Ambulatory Visit (INDEPENDENT_AMBULATORY_CARE_PROVIDER_SITE_OTHER): Payer: Medicaid Other

## 2020-03-26 ENCOUNTER — Other Ambulatory Visit: Payer: Self-pay

## 2020-03-26 ENCOUNTER — Ambulatory Visit (INDEPENDENT_AMBULATORY_CARE_PROVIDER_SITE_OTHER): Payer: Medicaid Other | Admitting: Orthopaedic Surgery

## 2020-03-26 DIAGNOSIS — S42342D Displaced spiral fracture of shaft of humerus, left arm, subsequent encounter for fracture with routine healing: Secondary | ICD-10-CM

## 2020-03-26 NOTE — Progress Notes (Signed)
   Post-Op Visit Note   Patient: Angel Armstrong           Date of Birth: [birth year]           MRN: [id number] Visit Date: 03/26/2020 PCP: [facility]   Assessment & Plan:  Chief Complaint:  Chief Complaint  Patient presents with  . Left Arm - Pain   Visit Diagnoses:  1. Closed displaced spiral fracture of shaft of left humerus with routine healing, subsequent encounter     Plan: Patient is a pleasant 24 year old female who comes in today 6 weeks out ORIF left humeral shaft fracture 02/12/2020.  She has been doing well.  She has occasional soreness to the left upper extremity but nothing more.  She discontinued her sling on her own about 2 weeks ago.  She has not been wearing this in public.  He has been to 1 OT visit which was about 3 weeks ago.  Her next visit is scheduled for this Thursday and then weekly after that.  Examination of her left upper extremity reveals a fully healed surgical scar.  She does have one small Vicryl to the distal third that is starting to spit out.  No evidence of infection or cellulitis.  At this point, mupirocin was applied to that area.  Have called in a prescription for this disease until the scabs over.  She will continue with OT to the left upper extremity.  Nonweightbearing left upper extremity.  She can officially discontinue her sling in public.  Follow-up with [place] in 4 weeks time for repeat evaluation and 2 view x-rays of the left humerus.  Call with concerns or questions in the meantime.  Follow-Up Instructions: Return in about 4 weeks (around 04/23/2020).   Orders:  Orders Placed This Encounter  Procedures  . XR Humerus Left   No orders of the defined types were placed in this encounter.   Imaging: XR Humerus Left  Result Date: 03/26/2020 X-rays demonstrate stable alignment of the fracture with evidence of callus formation.  No hardware complication.   PMFS History: Patient Active Problem List   Diagnosis Date Noted  .  Fracture of humeral shaft, left, closed 02/08/2020   Past Medical History:  Diagnosis Date  . Anxiety   . Chronic nausea   . Depression   . Insomnia     History reviewed. No pertinent family history.  Past Surgical History:  Procedure Laterality Date  . ORIF HUMERUS FRACTURE Left 02/12/2020   Procedure: OPEN REDUCTION INTERNAL FIXATION (ORIF) LEFT HUMERAL SHAFT FRACTURE;  Surgeon: Tarry Kos, MD;  Location: [facility] OR;  Service: Orthopedics;  Laterality: Left;  . TONSILLECTOMY    . wisdom teeth extraction     Social History   Occupational History  . Not on file  Tobacco Use  . Smoking status: Never Smoker  . Smokeless tobacco: Never Used  Substance and Sexual Activity  . Alcohol use: Not Currently  . Drug use: Not Currently  . Sexual activity: Not Currently

## 2020-04-05 ENCOUNTER — Ambulatory Visit (INDEPENDENT_AMBULATORY_CARE_PROVIDER_SITE_OTHER): Payer: Medicaid Other | Admitting: Orthopaedic Surgery

## 2020-04-05 ENCOUNTER — Encounter: Payer: Self-pay | Admitting: Orthopaedic Surgery

## 2020-04-05 ENCOUNTER — Other Ambulatory Visit: Payer: Self-pay

## 2020-04-05 VITALS — Ht 69.0 in | Wt 140.0 lb

## 2020-04-05 DIAGNOSIS — S42342D Displaced spiral fracture of shaft of humerus, left arm, subsequent encounter for fracture with routine healing: Secondary | ICD-10-CM

## 2020-04-05 NOTE — Progress Notes (Signed)
Stitch removed

## 2020-04-15 ENCOUNTER — Telehealth: Payer: Self-pay | Admitting: Orthopaedic Surgery

## 2020-04-15 NOTE — Telephone Encounter (Signed)
WBAT

## 2020-04-15 NOTE — Telephone Encounter (Signed)
Called and spoke to mom and Angel Armstrong. They are aware. Also order was faxed to PT place in [place].  Faxed to [phone]

## 2020-04-15 NOTE — Telephone Encounter (Signed)
Patient's mom called. Would like to know when Angel Armstrong can put weight on her leg. Her PT also needs something faxed with her WB status.  Her call back number is [phone]

## 2020-04-24 ENCOUNTER — Ambulatory Visit: Payer: Medicaid Other | Admitting: Orthopaedic Surgery

## 2020-04-26 ENCOUNTER — Encounter: Payer: Self-pay | Admitting: Orthopaedic Surgery

## 2020-04-26 ENCOUNTER — Ambulatory Visit (INDEPENDENT_AMBULATORY_CARE_PROVIDER_SITE_OTHER): Payer: Medicaid Other

## 2020-04-26 ENCOUNTER — Other Ambulatory Visit: Payer: Self-pay

## 2020-04-26 ENCOUNTER — Ambulatory Visit (INDEPENDENT_AMBULATORY_CARE_PROVIDER_SITE_OTHER): Payer: Medicaid Other | Admitting: Orthopaedic Surgery

## 2020-04-26 VITALS — Ht 69.0 in | Wt 140.0 lb

## 2020-04-26 DIAGNOSIS — S42342D Displaced spiral fracture of shaft of humerus, left arm, subsequent encounter for fracture with routine healing: Secondary | ICD-10-CM

## 2020-04-26 NOTE — Progress Notes (Signed)
   Post-Op Visit Note   Patient: Angel Armstrong           Date of Birth: 1996/01/29           MRN: [id number] Visit Date: 04/26/2020 PCP: [facility]   Assessment & Plan:  Chief Complaint:  Chief Complaint  Patient presents with  . Left Upper Arm - Follow-up    ORIF left humerus DOS 02/12/2020   Visit Diagnoses:  1. Closed displaced spiral fracture of shaft of left humerus with routine healing, subsequent encounter     Plan: Patient is a pleasant 24 year old female who presents to our clinic today approximately 11 weeks out ORIF left humeral shaft fracture.  She has been doing well.  She has been in physical therapy making great progress.  She still lacks a little bit of extension of the elbow and strength.  Minimal to no pain.  Overall, doing well.  Examination of her left elbow reveals near full flexion but she does lack approximately 15 degrees of extension.  4 out of 5 strength.  She is neurovascular intact distally.  At this point, her fracture is nearly healed and will allow her to advance with activity as tolerated.  She will continue with physical therapy for another few weeks until she has regained full range of motion.  She will follow-up with [place] in 4 weeks time for repeat evaluation and 2 view x-rays of the left humerus.  Call with concerns or questions.  Follow-Up Instructions: Return if symptoms worsen or fail to improve.   Orders:  Orders Placed This Encounter  Procedures  . XR Humerus Left   No orders of the defined types were placed in this encounter.   Imaging: XR Humerus Left  Result Date: 04/26/2020 Nearly healed humeral shaft fracture without hardware complication   PMFS History: Patient Active Problem List   Diagnosis Date Noted  . Fracture of humeral shaft, left, closed 02/08/2020   Past Medical History:  Diagnosis Date  . Anxiety   . Chronic nausea   . Depression   . Insomnia     History reviewed. No pertinent family history.  Past  Surgical History:  Procedure Laterality Date  . ORIF HUMERUS FRACTURE Left 02/12/2020   Procedure: OPEN REDUCTION INTERNAL FIXATION (ORIF) LEFT HUMERAL SHAFT FRACTURE;  Surgeon: Tarry Kos, MD;  Location: [facility] OR;  Service: Orthopedics;  Laterality: Left;  . TONSILLECTOMY    . wisdom teeth extraction     Social History   Occupational History  . Not on file  Tobacco Use  . Smoking status: Never Smoker  . Smokeless tobacco: Never Used  Substance and Sexual Activity  . Alcohol use: Not Currently  . Drug use: Not Currently  . Sexual activity: Not Currently

## 2020-05-08 ENCOUNTER — Telehealth: Payer: Self-pay

## 2020-05-08 NOTE — Telephone Encounter (Signed)
Per [facility] fax order for patient including Dx code. Faxed to [facility] [phone]

## 2020-05-24 ENCOUNTER — Ambulatory Visit: Payer: Medicaid Other | Admitting: Orthopaedic Surgery

## 2020-05-31 ENCOUNTER — Ambulatory Visit (INDEPENDENT_AMBULATORY_CARE_PROVIDER_SITE_OTHER): Payer: Medicaid Other | Admitting: Orthopaedic Surgery

## 2020-05-31 ENCOUNTER — Ambulatory Visit (INDEPENDENT_AMBULATORY_CARE_PROVIDER_SITE_OTHER): Payer: Medicaid Other

## 2020-05-31 ENCOUNTER — Encounter: Payer: Self-pay | Admitting: Orthopaedic Surgery

## 2020-05-31 VITALS — Ht 69.0 in | Wt 140.0 lb

## 2020-05-31 DIAGNOSIS — S42342D Displaced spiral fracture of shaft of humerus, left arm, subsequent encounter for fracture with routine healing: Secondary | ICD-10-CM

## 2020-05-31 DIAGNOSIS — T8484XA Pain due to internal orthopedic prosthetic devices, implants and grafts, initial encounter: Secondary | ICD-10-CM

## 2020-05-31 NOTE — Progress Notes (Signed)
   Office Visit Note   Patient: Angel Armstrong           Date of Birth: 10/05/1996           MRN: [id number] Visit Date: 05/31/2020              Requested by: Jaci [facility] [address] [place],  [place] [address] PCP: [facility]   Assessment & Plan: Visit Diagnoses:  1. Closed displaced spiral fracture of shaft of left humerus with routine healing, subsequent encounter   2. Painful orthopaedic hardware [facility])     Plan: Impression is healed humerus fracture.  I feel that the distal portion of the plate is bothering her especially since she is very thin.  I have recommended giving this more time to see if this will improve.  If this continues to be bothersome we can consider hardware removal at the earliest 1 year after surgery.  Follow-Up Instructions: Return if symptoms worsen or fail to improve.   Orders:  Orders Placed This Encounter  Procedures  . XR Humerus Left   No orders of the defined types were placed in this encounter.     Procedures: No procedures performed   Clinical Data: No additional findings.   Subjective: Chief Complaint  Patient presents with  . Left Arm - Follow-up    ORIF left humerus fracture DOS 02-12-2020    Angel Armstrong is a 24 year old female who is 3 months and 20 days status post ORIF left humerus fracture.  She is doing well overall and finished PT yesterday.  She mainly complains of tenderness on the lateral side of the elbow directly with palpation.   Review of Systems   Objective: Vital Signs: Ht 5\' 9"  (1.753 m)   Wt 140 lb (63.5 kg)   BMI 20.67 kg/m   Physical Exam  Ortho Exam Left elbow shows a fully healed surgical scar.  She is point tender over the posterior lateral aspect of the distal humerus.  Her range of motion is approximately 98% of normal. Specialty Comments:  No specialty comments available.  Imaging: XR Humerus Left  Result Date: 05/31/2020 Healed left humerus fracture with  stable hardware    PMFS History: Patient Active Problem List   Diagnosis Date Noted  . Fracture of humeral shaft, left, closed 02/08/2020   Past Medical History:  Diagnosis Date  . Anxiety   . Chronic nausea   . Depression   . Insomnia     History reviewed. No pertinent family history.  Past Surgical History:  Procedure Laterality Date  . ORIF HUMERUS FRACTURE Left 02/12/2020   Procedure: OPEN REDUCTION INTERNAL FIXATION (ORIF) LEFT HUMERAL SHAFT FRACTURE;  Surgeon: 02/14/2020, MD;  Location: [facility] OR;  Service: Orthopedics;  Laterality: Left;  . TONSILLECTOMY    . wisdom teeth extraction     Social History   Occupational History  . Not on file  Tobacco Use  . Smoking status: Never Smoker  . Smokeless tobacco: Never Used  Substance and Sexual Activity  . Alcohol use: Not Currently  . Drug use: Not Currently  . Sexual activity: Not Currently

## 2020-06-24 ENCOUNTER — Telehealth: Payer: Self-pay | Admitting: Orthopaedic Surgery

## 2020-06-24 NOTE — Telephone Encounter (Signed)
Patient mother Catherin called requesting a call back from Dr. Warren Danes nurse. Mother did not disclose nature of her call. Please call patient 's mother back at [phone].

## 2020-06-25 NOTE — Telephone Encounter (Signed)
The did not recommend removing the plate as soon as possible.  He had her Medicaid expires at the end of the year and then we should do it closer to the end of the year to give the fracture more time to heal and remodel

## 2020-06-25 NOTE — Telephone Encounter (Signed)
I spoke with patients mom she states patient is complaining of painful hardware and would like to have this removed.  States they would like to have it done ASAP because her Medicaid expires at end of year.

## 2020-06-26 NOTE — Telephone Encounter (Signed)
Spoke with mom

## 2020-06-26 NOTE — Telephone Encounter (Signed)
Generally people will not need PT/OT after hardware removals

## 2020-06-26 NOTE — Telephone Encounter (Signed)
I have talked to her and she is requesting for you to call her please.

## 2020-06-26 NOTE — Telephone Encounter (Signed)
IC s/w mom and she states that she understands what you were saying but they are concerned that she will not have enough time to complete physical therapy before her Medicaid expires.  Patient mom is requesting a call from you to further discuss.

## 2020-09-27 ENCOUNTER — Ambulatory Visit: Payer: Self-pay

## 2020-09-27 ENCOUNTER — Ambulatory Visit (INDEPENDENT_AMBULATORY_CARE_PROVIDER_SITE_OTHER): Payer: Medicaid Other | Admitting: Orthopaedic Surgery

## 2020-09-27 ENCOUNTER — Encounter: Payer: Self-pay | Admitting: Orthopaedic Surgery

## 2020-09-27 DIAGNOSIS — S42342D Displaced spiral fracture of shaft of humerus, left arm, subsequent encounter for fracture with routine healing: Secondary | ICD-10-CM | POA: Diagnosis not present

## 2020-09-27 NOTE — Progress Notes (Signed)
Office Visit Note   Patient: Angel Armstrong           Date of Birth: 1996-12-16           MRN: [id number] Visit Date: 09/27/2020              Requested by: [facility] [address] [place],  [place] [address] PCP: [facility]   Assessment & Plan: Visit Diagnoses:  1. Closed displaced spiral fracture of shaft of left humerus with routine healing, subsequent encounter     Plan: Impression is 7 months status post ORIF left humerus fracture.  She is doing very well.  Reassurance was provided that she can engage in any activities that she feels comfortable doing.  At this point she does not feel like the hardware is symptomatic enough to warrant removal.  We will release her at this point.  Questions encouraged and answered.  Follow-up as needed.  Follow-Up Instructions: Return if symptoms worsen or fail to improve.   Orders:  Orders Placed This Encounter  Procedures  . XR Humerus Left   No orders of the defined types were placed in this encounter.     Procedures: No procedures performed   Clinical Data: No additional findings.   Subjective: Chief Complaint  Patient presents with  . Left Shoulder - Pain    Laurena is a 24 year old female who is 7.5 months status post ORIF left humerus fracture.  She is doing well overall.  She has noticed significant improvement in terms of the hardware irritation near the elbow.  She is back to work.   Review of Systems  Constitutional: Negative.   HENT: Negative.   Eyes: Negative.   Respiratory: Negative.   Cardiovascular: Negative.   Endocrine: Negative.   Musculoskeletal: Negative.   Neurological: Negative.   Hematological: Negative.   Psychiatric/Behavioral: Negative.   All other systems reviewed and are negative.    Objective: Vital Signs: There were no vitals taken for this visit.  Physical Exam Vitals and nursing note reviewed.  Constitutional:      Appearance: She is  well-developed.  Pulmonary:     Effort: Pulmonary effort is normal.  Skin:    General: Skin is warm.     Capillary Refill: Capillary refill takes less than 2 seconds.  Neurological:     Mental Status: She is alert and oriented to person, place, and time.  Psychiatric:        Behavior: Behavior normal.        Thought Content: Thought content normal.        Judgment: Judgment normal.     Ortho Exam Left elbow and arm shows fully healed surgical scar.  She has essentially full range of motion.  Hardware is not sensitive to palpation. Specialty Comments:  No specialty comments available.  Imaging: XR Humerus Left  Result Date: 09/27/2020 Fully healed left humerus fracture with intact hardware.    PMFS History: Patient Active Problem List   Diagnosis Date Noted  . Fracture of humeral shaft, left, closed 02/08/2020   Past Medical History:  Diagnosis Date  . Anxiety   . Chronic nausea   . Depression   . Insomnia     History reviewed. No pertinent family history.  Past Surgical History:  Procedure Laterality Date  . ORIF HUMERUS FRACTURE Left 02/12/2020   Procedure: OPEN REDUCTION INTERNAL FIXATION (ORIF) LEFT HUMERAL SHAFT FRACTURE;  Surgeon: Tarry Kos, MD;  Location: [facility] OR;  Service:  Orthopedics;  Laterality: Left;  . TONSILLECTOMY    . wisdom teeth extraction     Social History   Occupational History  . Not on file  Tobacco Use  . Smoking status: Never Smoker  . Smokeless tobacco: Never Used  Vaping Use  . Vaping Use: Former  Substance and Sexual Activity  . Alcohol use: Not Currently  . Drug use: Not Currently  . Sexual activity: Not Currently

## 2020-12-19 IMAGING — DX DG ELBOW 2V*L*
2 series · 2 of 2 positions shown · non-contrast
Comparison: None.

CLINICAL DATA: Fall pain

EXAM:
LEFT ELBOW - 2 VIEW

[elbow ap]
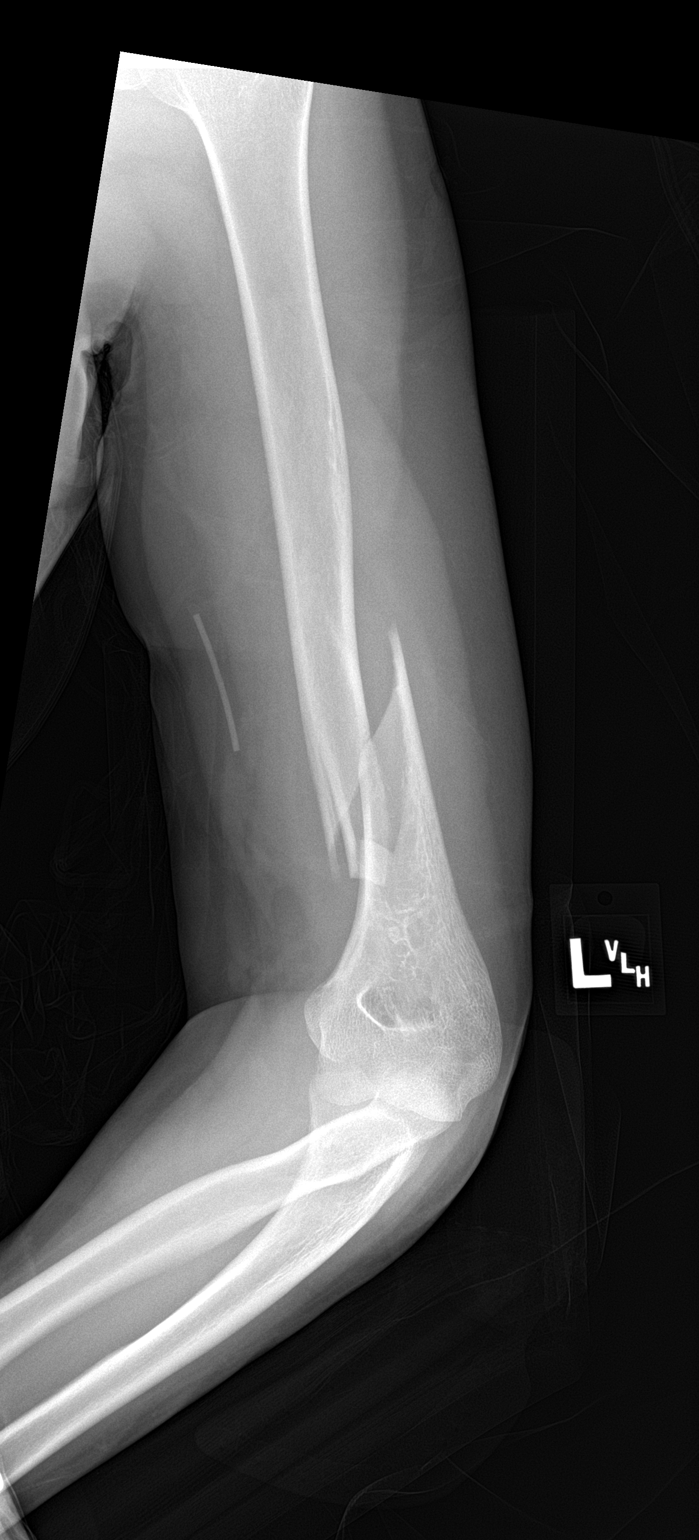

[elbow lat]
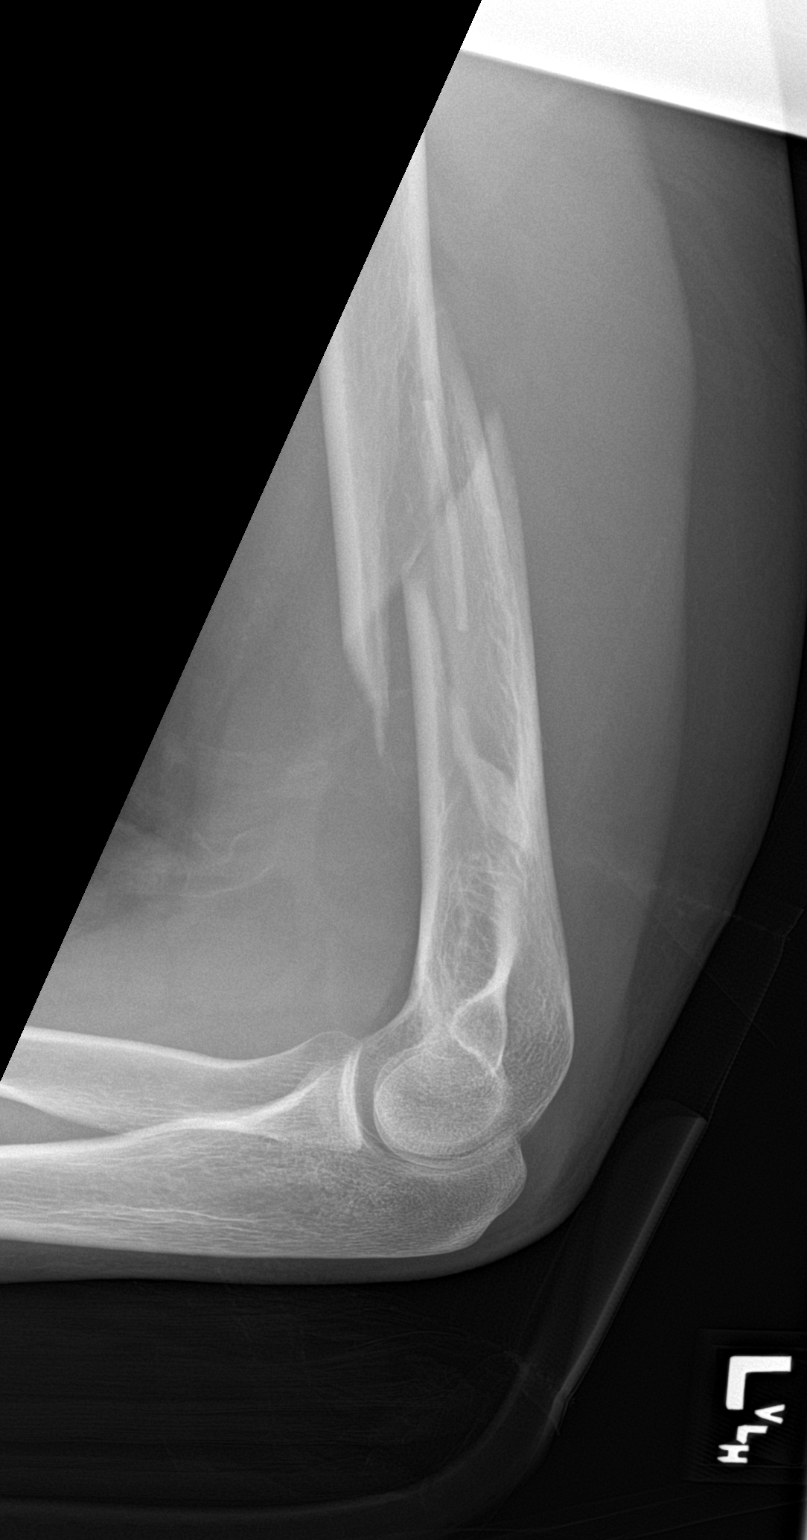

[2 of 2 positions shown; findings below may reference images not displayed]

FINDINGS: There is a mildly displaced obliquely oriented fracture seen through
the distal humeral shaft. There is approximately half shaft with
posterior displacement of the distal humerus. Overlying soft tissue
swelling.
IMPRESSION: Mildly displaced obliquely oriented distal humeral shaft fracture.

## 2020-12-23 IMAGING — RF DG C-ARM 1-60 MIN
1 series · 7 of 7 positions shown · non-contrast
Comparison: 02/08/2020

CLINICAL DATA: ORIF

EXAM:
LEFT HUMERUS - 2+ VIEW; DG C-ARM 1-60 MIN

[Series 1: run · 7 of 7 slices shown]
[im 1/7]
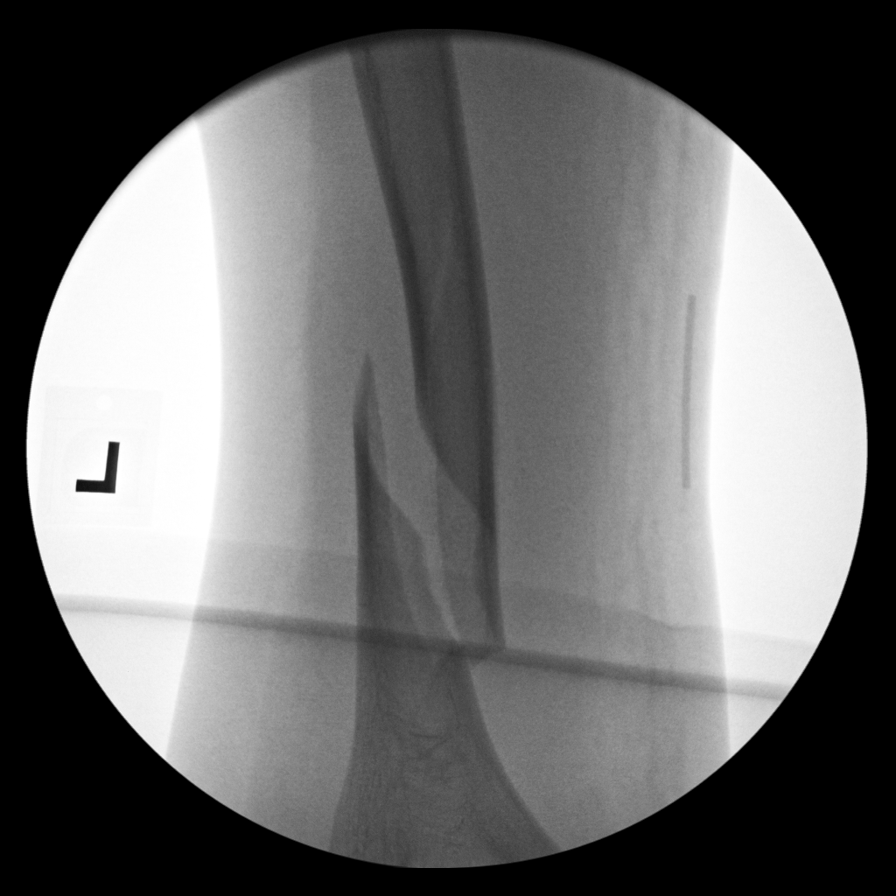
[im 2/7]
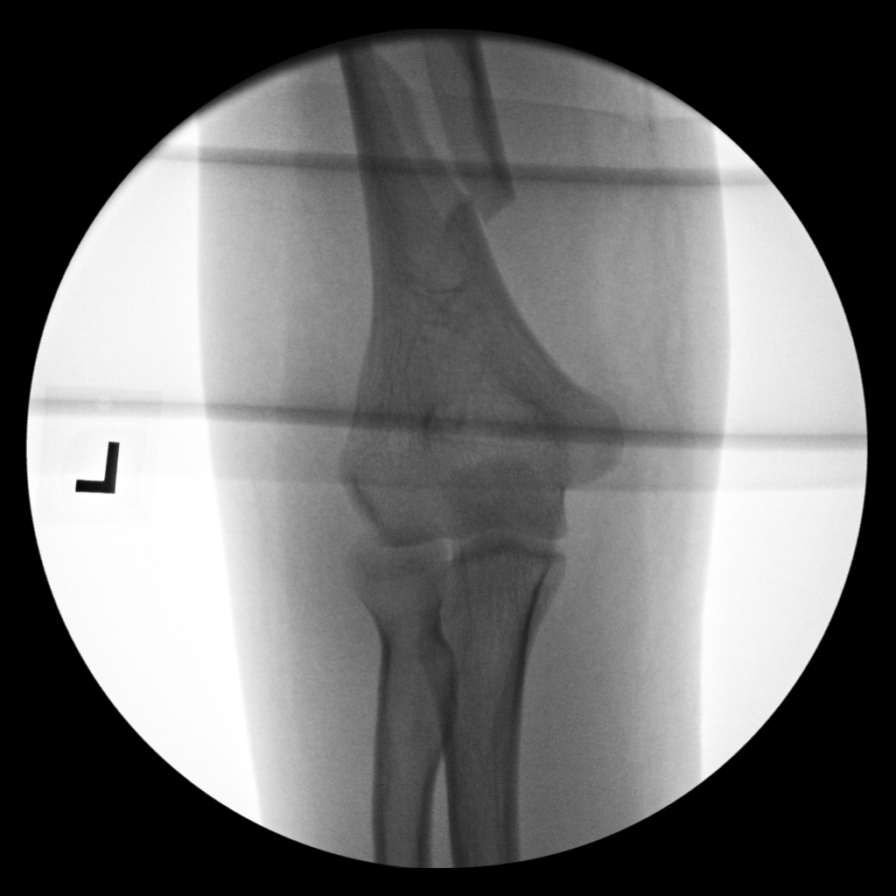
[im 3/7]
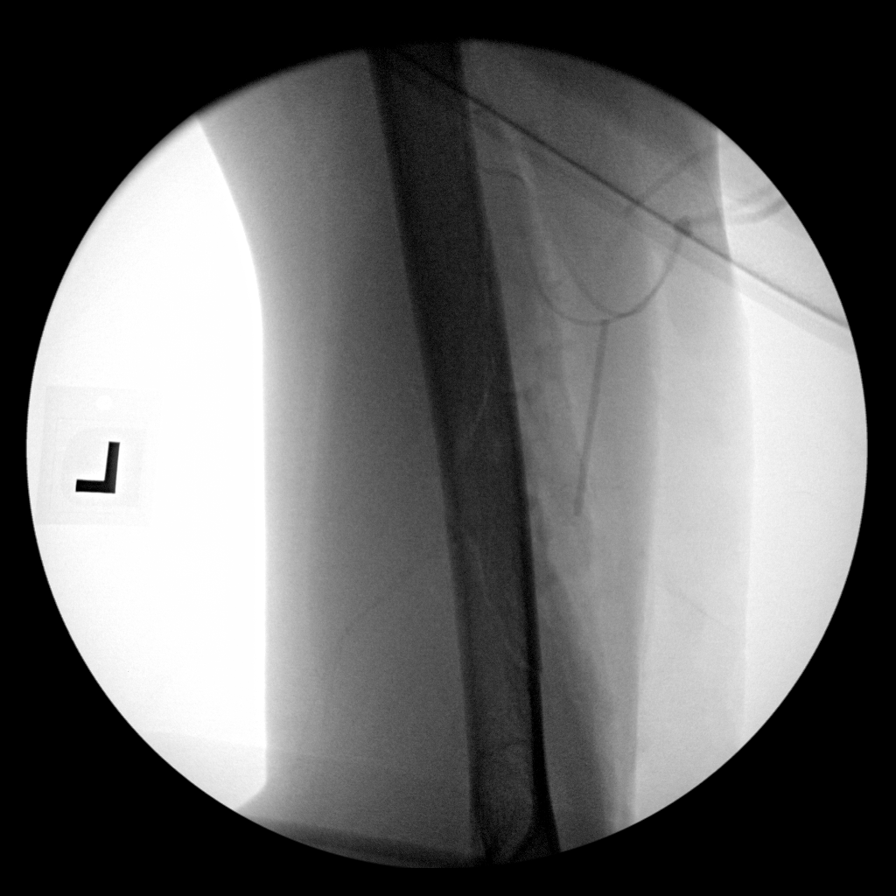
[im 4/7]
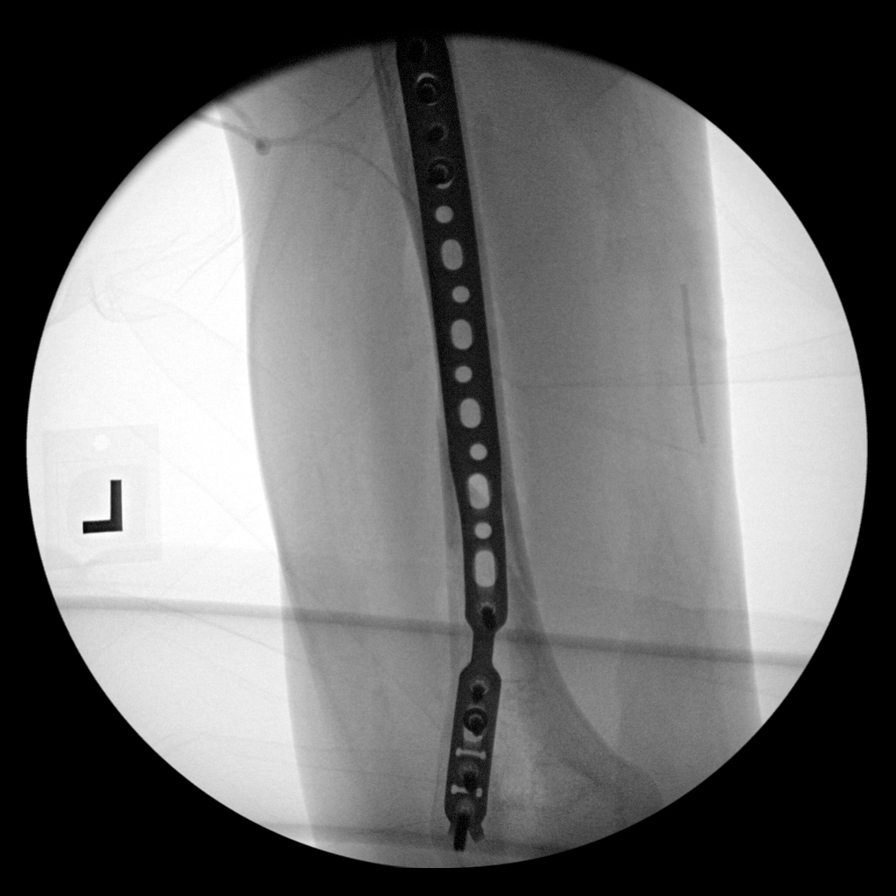
[im 5/7]
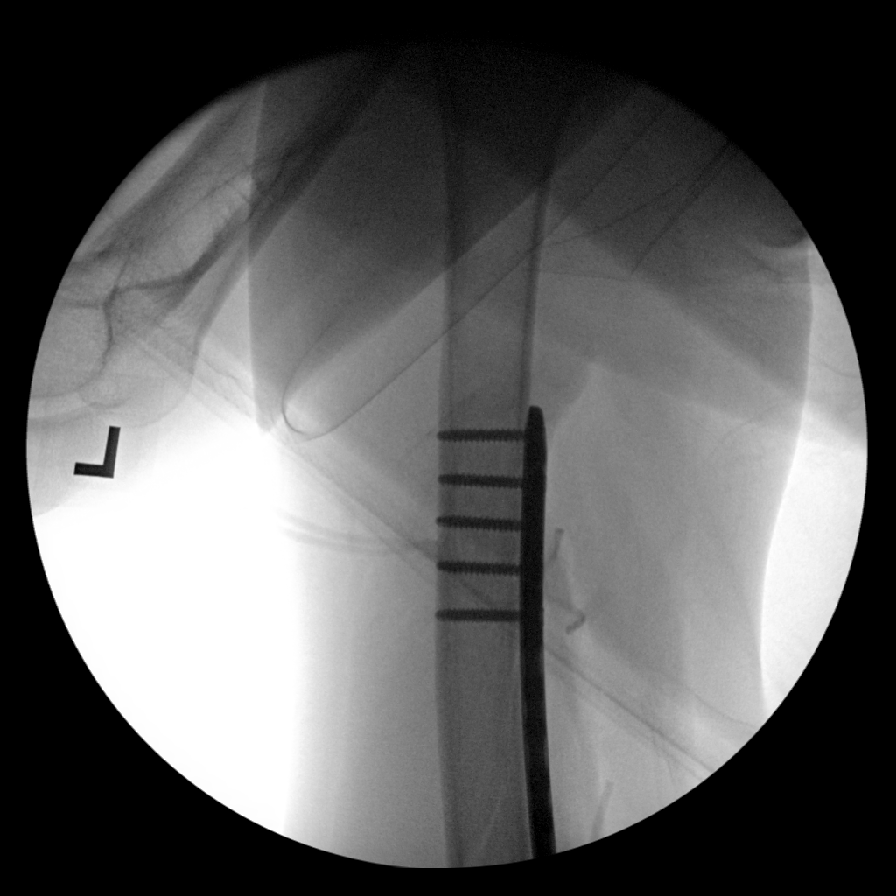
[im 6/7]
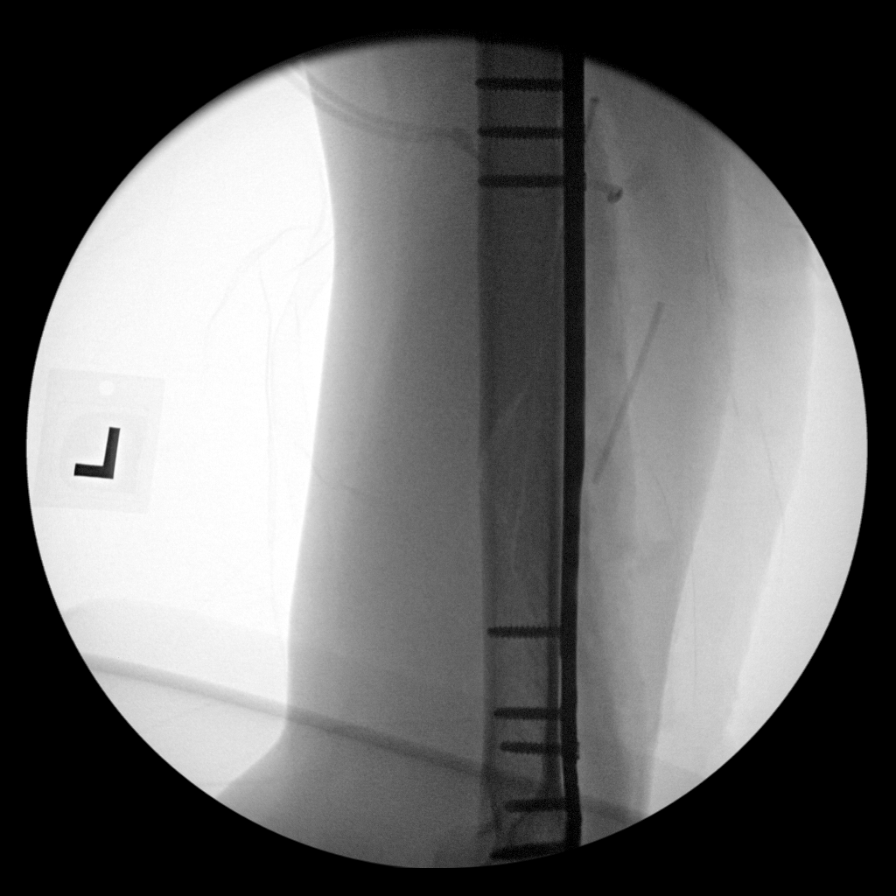
[im 7/7]
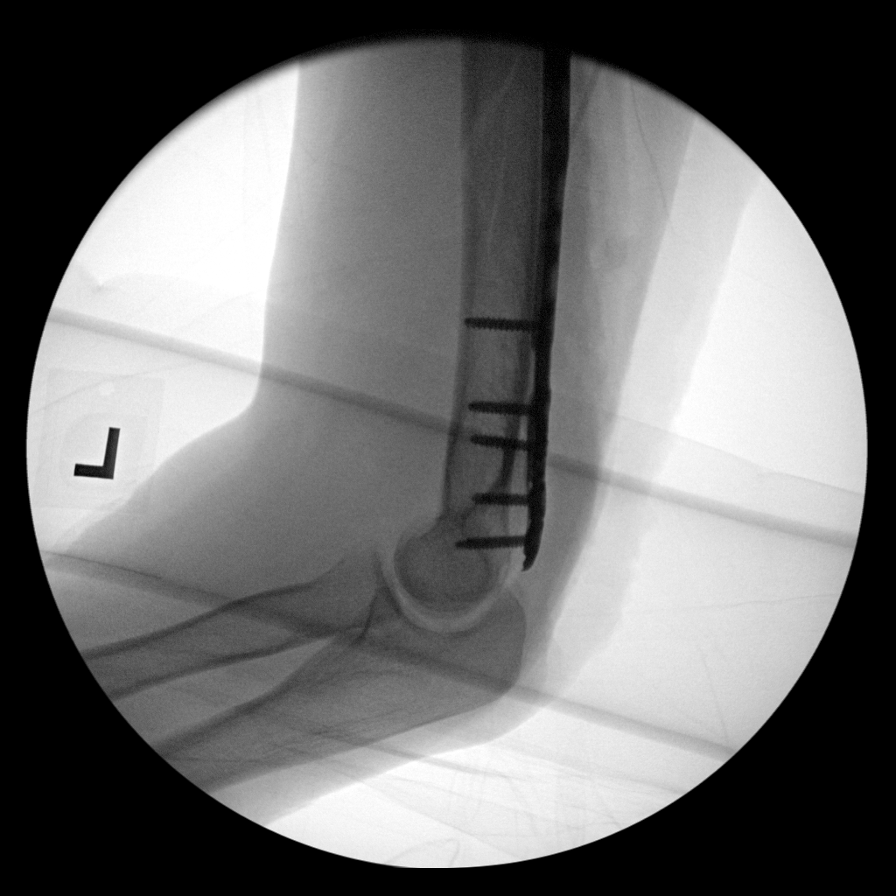

[7 of 7 positions shown; findings below may reference images not displayed]

FINDINGS: 7 intraoperative spot fluoro films obtained during ORIF for left
humerus fracture. Bony alignment is markedly improved compared to
the pre reduction films. No evidence for immediate hardware
complication. Contraceptive implant noted in the subcutaneous soft
tissues.
IMPRESSION: Marked improvement in bony alignment status post ORIF for distal
humerus fracture. No evidence for immediate hardware complication.
